# Patient Record
Sex: Male | Born: 1960 | Race: White | Hispanic: No | Marital: Married | Smoking: Former smoker
Health system: Southern US, Community
[De-identification: ages and names within clinical notes are randomized; demographics above are authoritative.]

## PROBLEM LIST (undated history)

## (undated) DIAGNOSIS — M549 Dorsalgia, unspecified: Secondary | ICD-10-CM

## (undated) DIAGNOSIS — G8929 Other chronic pain: Secondary | ICD-10-CM

## (undated) DIAGNOSIS — I10 Essential (primary) hypertension: Secondary | ICD-10-CM

## (undated) DIAGNOSIS — E7800 Pure hypercholesterolemia, unspecified: Secondary | ICD-10-CM

## (undated) HISTORY — PX: BACK SURGERY: SHX140

## (undated) HISTORY — PX: CORONARY ANGIOPLASTY WITH STENT PLACEMENT: SHX49

---

## 2003-05-25 ENCOUNTER — Encounter: Payer: Self-pay | Admitting: Family Medicine

## 2003-05-25 ENCOUNTER — Emergency Department (HOSPITAL_COMMUNITY): Admission: AD | Admit: 2003-05-25 | Discharge: 2003-05-25 | Payer: Self-pay | Admitting: Family Medicine

## 2009-06-27 ENCOUNTER — Emergency Department: Payer: Self-pay | Admitting: Emergency Medicine

## 2009-10-27 ENCOUNTER — Emergency Department (HOSPITAL_COMMUNITY): Admission: EM | Admit: 2009-10-27 | Discharge: 2009-10-27 | Payer: Self-pay | Admitting: Emergency Medicine

## 2010-10-27 LAB — POCT CARDIAC MARKERS
CKMB, poc: 1.4 ng/mL (ref 1.0–8.0)
CKMB, poc: <1 ng/mL — ABNORMAL LOW (ref 1.0–8.0)
Myoglobin, poc: 52.3 ng/mL (ref 12–200)
Troponin i, poc: <0.05 ng/mL (ref 0.00–0.09)

## 2010-10-27 LAB — DIFFERENTIAL
Basophils Absolute: 0.2 10*3/uL — ABNORMAL HIGH (ref 0.0–0.1)
Eosinophils Relative: 4 % (ref 0–5)
Lymphocytes Relative: 33 % (ref 12–46)
Monocytes Absolute: 0.6 10*3/uL (ref 0.1–1.0)
Monocytes Relative: 7 % (ref 3–12)

## 2010-10-27 LAB — COMPREHENSIVE METABOLIC PANEL
AST: 23 U/L (ref 0–37)
Albumin: 4 g/dL (ref 3.5–5.2)
Chloride: 103 mEq/L (ref 96–112)
Creatinine, Ser: 0.83 mg/dL (ref 0.4–1.5)
GFR calc Af Amer: >60 mL/min (ref >60)
Potassium: 4.1 mEq/L (ref 3.5–5.1)
Total Bilirubin: 0.3 mg/dL (ref 0.3–1.2)

## 2010-10-27 LAB — URINALYSIS, ROUTINE W REFLEX MICROSCOPIC
Protein, ur: NEGATIVE mg/dL
Urobilinogen, UA: 1 mg/dL (ref 0.0–1.0)

## 2010-10-27 LAB — HEMOCCULT GUIAC POC 1CARD (OFFICE): Fecal Occult Bld: NEGATIVE

## 2010-10-27 LAB — CBC
MCV: 94.1 fL (ref 78.0–100.0)
Platelets: 201 10*3/uL (ref 150–400)
WBC: 8.7 10*3/uL (ref 4.0–10.5)

## 2011-05-30 ENCOUNTER — Emergency Department: Payer: Self-pay | Admitting: Unknown Physician Specialty

## 2011-07-12 ENCOUNTER — Ambulatory Visit: Payer: Self-pay

## 2012-06-12 ENCOUNTER — Inpatient Hospital Stay: Payer: Self-pay | Admitting: Internal Medicine

## 2012-06-12 LAB — TROPONIN I
Troponin-I: <0.02 ng/mL
Troponin-I: 0.3 ng/mL — ABNORMAL HIGH

## 2012-06-12 LAB — CBC
HCT: 41.7 % (ref 40.0–52.0)
MCH: 32.3 pg (ref 26.0–34.0)
MCV: 92 fL (ref 80–100)
Platelet: 218 10*3/uL (ref 150–440)
RBC: 4.51 10*6/uL (ref 4.40–5.90)
WBC: 8.6 10*3/uL (ref 3.8–10.6)

## 2012-06-12 LAB — BASIC METABOLIC PANEL
Anion Gap: 5 — ABNORMAL LOW (ref 7–16)
Calcium, Total: 8.9 mg/dL (ref 8.5–10.1)
Chloride: 106 mmol/L (ref 98–107)
Osmolality: 277 (ref 275–301)
Potassium: 3.9 mmol/L (ref 3.5–5.1)

## 2012-06-12 LAB — CK TOTAL AND CKMB (NOT AT ARMC)
CK, Total: 121 U/L (ref 35–232)
CK-MB: 2.1 ng/mL (ref 0.5–3.6)

## 2012-06-13 LAB — CK TOTAL AND CKMB (NOT AT ARMC)
CK, Total: 165 U/L (ref 35–232)
CK, Total: 144 U/L (ref 35–232)
CK-MB: 7.1 ng/mL — ABNORMAL HIGH (ref 0.5–3.6)

## 2012-06-13 LAB — BASIC METABOLIC PANEL
Calcium, Total: 8.7 mg/dL (ref 8.5–10.1)
Co2: 28 mmol/L (ref 21–32)
EGFR (Non-African Amer.): >60
Glucose: 86 mg/dL (ref 65–99)
Osmolality: 282 (ref 275–301)
Potassium: 4.2 mmol/L (ref 3.5–5.1)
Sodium: 142 mmol/L (ref 136–145)

## 2012-06-13 LAB — TROPONIN I: Troponin-I: 3.6 ng/mL — ABNORMAL HIGH

## 2012-06-13 LAB — CBC WITH DIFFERENTIAL/PLATELET
Eosinophil #: 0.3 10*3/uL (ref 0.0–0.7)
Eosinophil %: 3.9 %
Lymphocyte %: 38.6 %
MCH: 33 pg (ref 26.0–34.0)
Monocyte #: 0.7 x10 3/mm (ref 0.2–1.0)
Neutrophil %: 48.2 %
Platelet: 195 10*3/uL (ref 150–440)
RBC: 4.22 10*6/uL — ABNORMAL LOW (ref 4.40–5.90)

## 2012-06-13 LAB — URINALYSIS, COMPLETE
Bacteria: NONE SEEN
Glucose,UR: NEGATIVE mg/dL (ref 0–75)
Nitrite: NEGATIVE
Ph: 6 (ref 4.5–8.0)
Specific Gravity: 1.009 (ref 1.003–1.030)
Squamous Epithelial: 1

## 2012-06-13 LAB — LIPID PANEL
Cholesterol: 218 mg/dL — ABNORMAL HIGH (ref 0–200)
HDL Cholesterol: 26 mg/dL — ABNORMAL LOW (ref 40–60)
VLDL Cholesterol, Calc: 48 mg/dL — ABNORMAL HIGH (ref 5–40)

## 2012-06-13 LAB — HEMOGLOBIN A1C: Hemoglobin A1C: 5.2 % (ref 4.2–6.3)

## 2012-06-14 LAB — BASIC METABOLIC PANEL
Anion Gap: 8 (ref 7–16)
Calcium, Total: 8.6 mg/dL (ref 8.5–10.1)
Co2: 25 mmol/L (ref 21–32)
Creatinine: 0.75 mg/dL (ref 0.60–1.30)
EGFR (African American): >60
Osmolality: 274 (ref 275–301)

## 2012-07-04 ENCOUNTER — Encounter: Payer: Self-pay | Admitting: Cardiology

## 2012-08-03 ENCOUNTER — Encounter: Payer: Self-pay | Admitting: Cardiology

## 2013-02-04 ENCOUNTER — Emergency Department: Payer: Self-pay | Admitting: Emergency Medicine

## 2013-02-04 LAB — TROPONIN I: Troponin-I: <0.02 ng/mL

## 2013-02-04 LAB — PROTIME-INR: Prothrombin Time: 12.7 secs (ref 11.5–14.7)

## 2013-02-04 LAB — BASIC METABOLIC PANEL
Calcium, Total: 8.9 mg/dL (ref 8.5–10.1)
Chloride: 108 mmol/L — ABNORMAL HIGH (ref 98–107)
EGFR (African American): >60
EGFR (Non-African Amer.): >60
Osmolality: 277 (ref 275–301)
Potassium: 5.3 mmol/L — ABNORMAL HIGH (ref 3.5–5.1)
Sodium: 139 mmol/L (ref 136–145)

## 2013-02-04 LAB — CBC
MCH: 33.5 pg (ref 26.0–34.0)
MCHC: 35.5 g/dL (ref 32.0–36.0)
Platelet: 208 10*3/uL (ref 150–440)

## 2013-02-04 LAB — PRO B NATRIURETIC PEPTIDE: B-Type Natriuretic Peptide: 79 pg/mL (ref 0–125)

## 2013-11-06 ENCOUNTER — Emergency Department: Payer: Self-pay | Admitting: Emergency Medicine

## 2013-11-06 LAB — CBC
HCT: 41.7 % (ref 40.0–52.0)
HGB: 14.3 g/dL (ref 13.0–18.0)
MCH: 31.9 pg (ref 26.0–34.0)
MCHC: 34.3 g/dL (ref 32.0–36.0)
MCV: 93 fL (ref 80–100)
PLATELETS: 191 10*3/uL (ref 150–440)
RBC: 4.49 10*6/uL (ref 4.40–5.90)
RDW: 13.2 % (ref 11.5–14.5)
WBC: 8.4 10*3/uL (ref 3.8–10.6)

## 2013-11-06 LAB — BASIC METABOLIC PANEL
ANION GAP: 3 — AB (ref 7–16)
BUN: 14 mg/dL (ref 7–18)
Calcium, Total: 8.6 mg/dL (ref 8.5–10.1)
Chloride: 108 mmol/L — ABNORMAL HIGH (ref 98–107)
Co2: 28 mmol/L (ref 21–32)
Creatinine: 0.86 mg/dL (ref 0.60–1.30)
Glucose: 97 mg/dL (ref 65–99)
Osmolality: 278 (ref 275–301)
Potassium: 4.3 mmol/L (ref 3.5–5.1)
Sodium: 139 mmol/L (ref 136–145)

## 2013-11-06 LAB — TROPONIN I: Troponin-I: <0.02 ng/mL

## 2014-11-20 NOTE — Discharge Summary (Signed)
PATIENT NAME:  Shawn Shelton, Shawn Shelton MR#:  [id number] DATE OF BIRTH:  [birth year]  DATE OF ADMISSION:  06/12/2012 DATE OF DISCHARGE:  06/14/2012  PRIMARY CARE PHYSICIAN: Dr. Lacie ScottsNiemeyer   CONSULTATION: Dr. Lady GaryFath, Cardiology   PROCEDURE: PCI   DISCHARGE DIAGNOSES:  1. Non-STEMI status post PCI stent placement.  2. Coronary artery disease.  3. Hypertension.  4. Hyperlipidemia.  5. Gastroesophageal reflux disease. 6. Tobacco abuse.    CONDITION: Stable.   CODE STATUS: FULL CODE.   HOME MEDICATIONS:  1. Aspirin 325 mg p.o. daily.  2. Lisinopril 10 mg p.o. daily.  3. Nitroglycerin 0.4 mg sublingual tablet 1 tablet every five minutes up to 3 times p.r.n. for angina, hypertension.  4. Plavix 75 mg p.o. daily.  5. Lopressor 0.5 mg tablet p.o. b.i.d.  6. Lipitor 40 mg p.o. at bedtime.   DO NOT TAKE: Diclofenac sodium 75 mg p.o. b.i.d. p.r.n. for pain.   DIET: Low sodium, low fat, low cholesterol diet.   ACTIVITY: As tolerated.   FOLLOW-UP CARE:  1. Follow-up with PCP within 1 to 2 weeks. 2. Follow-up with Dr. Lady GaryFath within one week.   Also, the patient was counseled for smoking cessation.   REASON FOR ADMISSION: Chest pain.   HOSPITAL COURSE: The patient is a 54 year old Caucasian male with a history of GERD and smoking who presented to the ED with chest pain which was a burning sensation, was more heavy, he could not breathe, with radiation to the left shoulder and toward the back. For detailed history and physical examination, please refer to the admission note dictated by Dr. Nemiah CommanderKalisetti.  On admission date, the patient's troponin was 0.02, then increased to 0.3, then increased to 3.6. The patient was admitted for non-STEMI and was treated with heparin drip and nitroglycerin. Dr. Lady GaryFath did a cardiac cath which showed 90% ulcerated lesion in OM3 . The patient underwent PCI with DES. Dr. Lady GaryFath suggested Plavix, aspirin, and statin. The patient needs Plavix for one year and should stop smoking. In  addition, the patient's lipid panel showed LDL 144, HDL 26 with the diagnosis of hyperlipidemia. After PCI the patient has no symptoms at all. His vital signs are stable. He is clinically stable and will be discharged to home today.  I discussed the patient's discharge plan with the patient, the patient's wife, Dr. Lady GaryFath, and the case manager.   TIME SPENT: About 40 minutes.   ____________________________ Shawn Shelton Myesha Stillion, MD qc:drc D: 06/14/2012 15:42:37 ET T: 06/15/2012 11:19:35 ET JOB#: [id number]  cc: Shawn Shelton Josecarlos Harriott, MD, <Dictator> Meindert A. Lacie ScottsNiemeyer, MD Shawn Shelton Trinity Hyland MD ELECTRONICALLY SIGNED 06/16/2012 22:15

## 2014-11-20 NOTE — H&P (Signed)
PATIENT NAME:  Shawn Shelton, Shawn Shelton MR#:  [id number] DATE OF BIRTH:  1961/02/19  DATE OF ADMISSION:  06/12/2012  ADMITTING PHYSICIAN: Dr. Enid Baas  PRIMARY CARE PHYSICIAN: Dr. Lacie Scotts  CHIEF COMPLAINT: Chest pain.   HISTORY OF PRESENT ILLNESS: Shawn Shelton is a 55 year old Caucasian male with past medical history significant for gastroesophageal reflux disease problem and also ongoing smoking who was working in his yard this morning not doing a lot of strenuous work, was just raking some leaves presented to the ER complaining of chest pain. Patient says he has had reflux problems in the past mostly associated after eating at parties that improved immediately with symptomatic management. It initially started like heartburn in the chest and then it was more heavy that he couldn't breath, was radiating to his left shoulder and towards the back. He went inside the home and took some aspirin. He said he was sweating, felt hot and he also had some vinegar. He started belching after taking vinegar and thought that he felt better and then went outside just standing there, got the chest pain back again and came to the ER. Patient had EGD, colonoscopy done by Dr. Niel Hummer in the past 3 or 4 months ago which were normal according to him. Never had a stress test or other kind of cardiac work-up done.   PAST MEDICAL HISTORY:  1. Borderline diabetes mellitus.  2. Tobacco use disorder.  3. Recent back muscle spasm for which he is doing physical therapy.   PAST SURGICAL HISTORY: None.   MEDICATIONS AT HOME: He started on Diclofenac 75 mg p.o. b.i.d., has been taking it for the past month. He was also on prednisone taper which he finished about two weeks ago.   ALLERGIES TO MEDICATIONS: No known drug allergies.   SOCIAL HISTORY: Lives at home with his wife. Works as a [employer]. Occasional alcohol use. Smokes about 1 pack per day.   FAMILY HISTORY: Dad died in 9s, probably had kidney problems, patient is  not sure about and mom died in her 58s and he thinks it is a heart attack but is not completely sure.    REVIEW OF SYSTEMS: CONSTITUTIONAL: No fever, fatigue, weakness. EYES: No blurred vision, double vision, pain, inflammation or glaucoma. Uses contact lenses. ENT: No tinnitus, ear pain, hearing loss, epistaxis or discharge. RESPIRATORY: No cough, wheeze, hemoptysis, or chronic obstructive pulmonary disease. CARDIOVASCULAR: Positive for chest pain. Positive for orthopnea. No edema, arrhythmia, palpitations, or syncope. GASTROINTESTINAL: No nausea, vomiting, abdominal pain, hematemesis, or melena. GENITOURINARY: No dysuria, hematuria, renal calculus, frequency, or incontinence. ENDOCRINE: No polyuria, nocturia, thyroid problems, heat or cold intolerance. HEMATOLOGY: No anemia, easy bruising or bleeding. SKIN: No acne, rash, or lesions. MUSCULOSKELETAL: Positive for low back pain. No arthritis or gout. NEUROLOGICAL: No numbness, weakness, cerebrovascular accident, transient ischemic attack, or seizures. PSYCHOLOGICAL: No anxiety, insomnia, or depression.   PHYSICAL EXAMINATION:  VITAL SIGNS: Temperature 99 degrees Fahrenheit, pulse 67, respirations 20, blood pressure 179/94, pulse oximetry 97% on room air.   GENERAL: Well built, well nourished male sitting in bed, not in any acute distress.   HEENT: Normocephalic, atraumatic. Pupils equal, round, reacting to light. Anicteric sclerae. Extraocular movements intact. Oropharynx clear without erythema, mass or exudates.    NECK: Supple. No thyromegaly, JVD, or carotid bruits. No lymphadenopathy.   LUNGS: Clear to auscultation bilaterally. No wheeze or crackles. No use of accessory muscles for breathing.   CARDIOVASCULAR: S1, S2 regular rate and rhythm. No murmurs, rubs, or gallops.  No chest wall tenderness.   ABDOMEN: Soft, nontender, nondistended. No hepatosplenomegaly. Normal bowel sounds.   EXTREMITIES: No pedal edema. No clubbing or cyanosis. 2+  dorsalis pedis pulses palpable bilaterally.   SKIN: No acne, rash, or lesions.   LYMPHATICS: No cervical or inguinal lymphadenopathy.   NEUROLOGIC: Cranial nerves intact. No focal motor or sensory deficits.   PSYCHOLOGICAL: Patient is awake, alert, oriented x3.   LABORATORY, DIAGNOSTIC AND RADIOLOGICAL DATA: WBC 8.6, hemoglobin 14.6, hematocrit 41.7, platelet count 218.   Sodium 139, potassium 3.9, chloride 106, bicarbonate 28, BUN 13, creatinine 0.8, glucose 79, calcium 8.9. Troponin first set less than 0.02. CK 113, CK-MB 2.1. Repeat. CK at 121, CK-MB 4.0, troponin 0.3. PTT is 26.6. EKG showing normal sinus rhythm, incomplete right bundle branch block.   ASSESSMENT AND PLAN: 54 year old male with no significant past medical history other than gastroesophageal reflux disease comes in for chest pain and second set of elevated troponin at 0.3.  1. Non-ST segment elevation MI. Will admit to telemetry and keep him n.p.o. after midnight. Started on IV heparin drip. Aspirin given here. Started on nitroglycerin as well. His heart rate is in the 50s so will hold off on beta blocker. Cardiology consulted. Waiting for call back from Dr. Lady GaryFath and decision about cardiac catheterization after discussion with cardiology. 2. Hypertension. No prior history, currently elevated so will start IV hydralazine p.r.n. and low dose lisinopril. Again, beta blockers were held at this time secondary to bradycardia. 3. Gastroesophageal reflux disease. Continue IV Protonix b.i.d., especially since patient has been taking diclofenac over the past month. He said he had an EGD and colonoscopy done about 4 to 5 months ago at Dr. Marlan PalauIftikhar's office which were normal.   4. Tobacco use disorder. He has been counseled for three minutes. He refused nicotine patch at this time.  5. CODE STATUS: FULL CODE.   TIME SPENT ON ADMISSION: 50 minutes. ____________________________ Enid Baasadhika Erikson Danzy, MD rk:cms D: 06/12/2012 21:28:58  ET T: 06/13/2012 07:41:04 ET  JOB#: [id number] cc: Enid Baasadhika Aspin Palomarez, MD, <Dictator> Meindert A. Lacie ScottsNiemeyer, MD Lurline DelShaukat Iftikhar, MD Enid BaasADHIKA Marshawn Normoyle MD ELECTRONICALLY SIGNED 06/13/2012 17:09

## 2014-11-20 NOTE — Consult Note (Signed)
    General Aspect 54 yo male with history of borderline hypertension, hyperlipidemia and tobacco abuse who was admitted after developing chest pain while raking leaves yesterday. He presented to the er where he was note too have a mildly elevated serum troponin. He was admtted and subsequent troponin was elevated to 3.25. He has had no further chest pain. He has tobacco abuse, hypertension and hyperlipidemia. No family history.   Physical Exam:   GEN well developed, well nourished, no acute distress    HEENT PERRL, hearing intact to voice    NECK supple  No masses    RESP normal resp effort  clear BS    CARD Regular rate and rhythm  No murmur    ABD denies tenderness  no hernia  normal BS    LYMPH negative neck, negative axillae    EXTR negative cyanosis/clubbing, negative edema    SKIN normal to palpation    NEURO cranial nerves intact, motor/sensory function intact    PSYCH A+O to time, place, person   Review of Systems:   Subjective/Chief Complaint exertional and rest chest pain    General: No Complaints    Skin: No Complaints    ENT: No Complaints    Eyes: No Complaints    Neck: No Complaints    Respiratory: No Complaints    Cardiovascular: Chest pain or discomfort    Gastrointestinal: No Complaints    Genitourinary: No Complaints    Vascular: No Complaints    Musculoskeletal: No Complaints    Neurologic: No Complaints    Hematologic: No Complaints    Endocrine: No Complaints    Psychiatric: No Complaints    Review of Systems: All other systems were reviewed and found to be negative    Medications/Allergies Reviewed Medications/Allergies reviewed     Denies medical history:   Home Medications: Medication Instructions Status  diclofenac sodium 75 mg oral delayed release tablet 1 tab(s) orally 2 times a day as needed for pain.  Active  Aspirin Enteric Coated 81 mg oral delayed release tablet 2 tab(s) orally once a month as needed for aches.  Active   EKG:   EKG NSR    Abnormal NSSTTW changes    No Known Allergies:     Impression 54 yo male with history of tobacco abuse and mild hpertension and hyperlipidemia who was admitted with chest pain and has ruled in for a nstemi. His troponin is elevated to 3.6. He is currently stable. He denis chest pian but has a headache. Risk factors include tobacco abuse and mild hypertension and hyperlipidemia. Risk and benefits of cardiac cath were explained to the patient and he agrees to proceed.    Plan 1. COntinue current meds including  nitrates asa. Beta blockers initially held secondary to bradycardia. Will follow heart rate and add as tolerated.  2. Smoking cessation.  3. Statin for hyperlipidemia 4. Risk and benefits of cardiac cath explained to the patient and he agrees to proceed 5. Proceed with cardiac cath today. Furhter recs after cath.   Electronic Signatures: Dalia HeadingFath, Kenneth A (MD)  (Signed 11-Nov-13 08:02)  Authored: General Aspect/Present Illness, History and Physical Exam, Review of System, Past Medical History, Home Medications, EKG , Allergies, Impression/Plan   Last Updated: 11-Nov-13 08:02 by Dalia HeadingFath, Kenneth A (MD)

## 2015-07-10 ENCOUNTER — Emergency Department: Payer: 59

## 2015-07-10 ENCOUNTER — Emergency Department
Admission: EM | Admit: 2015-07-10 | Discharge: 2015-07-10 | Disposition: A | Discharge status: Home / Self Care | Payer: 59 | Attending: Emergency Medicine | Admitting: Emergency Medicine

## 2015-07-10 ENCOUNTER — Encounter: Payer: Self-pay | Admitting: Emergency Medicine

## 2015-07-10 DIAGNOSIS — I10 Essential (primary) hypertension: Secondary | ICD-10-CM | POA: Insufficient documentation

## 2015-07-10 DIAGNOSIS — F17200 Nicotine dependence, unspecified, uncomplicated: Secondary | ICD-10-CM | POA: Insufficient documentation

## 2015-07-10 DIAGNOSIS — M25512 Pain in left shoulder: Secondary | ICD-10-CM | POA: Diagnosis not present

## 2015-07-10 HISTORY — DX: Essential (primary) hypertension: I10

## 2015-07-10 LAB — CBC
HEMATOCRIT: 43.3 % (ref 40.0–52.0)
Hemoglobin: 14.9 g/dL (ref 13.0–18.0)
MCH: 32 pg (ref 26.0–34.0)
MCHC: 34.5 g/dL (ref 32.0–36.0)
MCV: 92.9 fL (ref 80.0–100.0)
Platelets: 190 10*3/uL (ref 150–440)
RBC: 4.66 MIL/uL (ref 4.40–5.90)
RDW: 13.1 % (ref 11.5–14.5)
WBC: 8.6 10*3/uL (ref 3.8–10.6)

## 2015-07-10 LAB — BASIC METABOLIC PANEL
Anion gap: 8 (ref 5–15)
BUN: 13 mg/dL (ref 6–20)
CALCIUM: 9.3 mg/dL (ref 8.9–10.3)
CHLORIDE: 106 mmol/L (ref 101–111)
CO2: 25 mmol/L (ref 22–32)
CREATININE: 0.97 mg/dL (ref 0.61–1.24)
GFR calc non Af Amer: >60 mL/min (ref >60)
GLUCOSE: 86 mg/dL (ref 65–99)
Potassium: 4.1 mmol/L (ref 3.5–5.1)
Sodium: 139 mmol/L (ref 135–145)

## 2015-07-10 LAB — TROPONIN I
Troponin I: <0.03 ng/mL (ref <0.031)
Troponin I: <0.03 ng/mL (ref <0.031)

## 2015-07-10 MED ORDER — CYCLOBENZAPRINE HCL 10 MG PO TABS: 10.0000 mg | ORAL_TABLET | Freq: Three times a day (TID) | ORAL | Status: DC | PRN

## 2015-07-10 NOTE — ED Notes (Signed)
Pt presents with mid sternal chest pain radiating down into left arm started one week ago.

## 2015-07-10 NOTE — Discharge Instructions (Signed)
You have been seen in the Emergency Department (ED) today for chest pain.  As we have discussed todays test results are normal, but you require follow-up with cardiology tomorrow or Friday.  Please call the cardiology clinic, left them know you're seen in the ER and that Dr. Juliann Paresallwood has advised you to [place] [facility] [place] GaryFath, or with one of the other cardiologists in the clinic by this Friday.  Please follow up with the recommended doctor as instructed above in these documents regarding todays emergent visit and your recent symptoms to discuss further management.  Continue to take your regular medications.   Return to the Emergency Department (ED) if you experience any further chest pain/pressure/tightness, difficulty breathing, or sudden sweating, or other symptoms that concern you.

## 2015-07-10 NOTE — ED Provider Notes (Signed)
[facility] Emergency Department Provider Note REMINDER - THIS NOTE IS NOT A FINAL MEDICAL RECORD UNTIL IT IS SIGNED. UNTIL THEN, THE CONTENT BELOW MAY REFLECT INFORMATION FROM A DOCUMENTATION TEMPLATE, NOT THE ACTUAL PATIENT VISIT. ____________________________________________  Time seen: Approximately 6:58 PM  I have reviewed the triage vital signs and the nursing notes.   HISTORY  Chief Complaint Chest Pain    HPI Shawn Shelton is a 54 y.o. male previous history of hypertension and coronary disease.  Patient reports for about the last week he's had discomfort in the left shoulder that radiates down into the left upper arm at times, it seems to be worse with use and movement. He denies nausea, vomiting, or specifically "chest pain" except for chronic discomfort which he experiences on a near daily basis for the last 2 years. 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  No trouble breathing. No leg swelling. No cough. No headache or neck pain.  He is compliant with his medications, but has not yet taken his lisinopril or Plavix this evening. He took 325 mg of aspirin a day which she takes on a daily basis.  Past Medical History  Diagnosis Date  . Hypertension     There are no active problems to display for this patient.   History reviewed. No pertinent past surgical history.  No current outpatient prescriptions on file.  Allergies Review of patient's allergies indicates no known allergies.  No family history on file.  Social History Social History  Substance Use Topics  . Smoking status: Current Some Day Smoker  . Smokeless tobacco: None  . Alcohol Use: No    Review of Systems Constitutional: No fever/chills Eyes: No visual changes. ENT: No sore throat. Cardiovascular: Denies chest pain for chronic discomfort which is unchanged. Respiratory: Denies shortness of breath. Gastrointestinal: No  abdominal pain.  No nausea, no vomiting.  No diarrhea.  No constipation. Genitourinary: Negative for dysuria. Musculoskeletal: Negative for back pain. Skin: Negative for rash. Neurological: Negative for headaches, focal weakness or numbness. No weakness or numbness in the left hand.  10-point ROS otherwise negative.  ____________________________________________   PHYSICAL EXAM:  VITAL SIGNS: ED Triage Vitals  Enc Vitals Group     BP 07/10/15 1755 142/86 mmHg     Pulse Rate 07/10/15 1755 78     Resp 07/10/15 1755 18     Temp 07/10/15 1755 98 F (36.7 C)     Temp Source 07/10/15 1755 Oral     SpO2 07/10/15 1755 96 %     Weight 07/10/15 1755 235 lb (106.595 kg)     Height 07/10/15 1755  (1.803 m)     Head Cir --      Peak Flow --      Pain Score 07/10/15 1749 5     Pain Loc --      Pain Edu? --      Excl. in GC? --    Constitutional: Alert and oriented. Well appearing and in no acute distress. Eyes: Conjunctivae are normal. PERRL. EOMI. Head: Atraumatic. Nose: No congestion/rhinnorhea. Mouth/Throat: Mucous membranes are moist.  Oropharynx non-erythematous. Neck: No stridor.   Cardiovascular: Normal rate, regular rhythm. Grossly normal heart sounds.  Good peripheral circulation. Respiratory: Normal respiratory effort.  No retractions. Lungs CTAB. Gastrointestinal: Soft and nontender. No distention. No abdominal bruits. No CVA tenderness. Musculoskeletal: No lower extremity tenderness nor edema.  No joint effusions. Neurologic:  Normal speech  and language. No gross focal neurologic deficits are appreciated. No gait instability. Skin:  Skin is warm, dry and intact. No rash noted. Psychiatric: Mood and affect are normal. Speech and behavior are normal.  ____________________________________________   LABS (all labs ordered are listed, but only abnormal results are displayed)  Labs Reviewed  BASIC METABOLIC PANEL  TROPONIN I  CBC  TROPONIN I    ____________________________________________  EKG  Reviewed and interpreted by me at 1755 Ventricular rate 80 PR 170 QRS 100 QTc 4:30 No acute T-wave abnormality Incomplete right bundle-branch block No evidence of acute ischemic abnormality Reviewed and interpreted as normal sinus rhythm, incomplete right bundle-branch block  Discussed with Dr. Juliann Pares ____________________________________________  RADIOLOGY  DG Chest 2 View (Final result) Result time: 07/10/15 18:26:25   Final result by Rad Results In Interface (07/10/15 18:26:25)   Narrative:   CLINICAL DATA: Three-week history of chest pain and bilateral arm pain.  EXAM: CHEST 2 VIEW  COMPARISON: 02/04/2013  FINDINGS: The cardiac silhouette, mediastinal and hilar contours are within normal limits and stable. The lungs are clear. No pleural effusion. The bony thorax is intact.  IMPRESSION: No acute cardiopulmonary findings.   Electronically Signed By: Rudie Meyer M.D. On: 07/10/2015 18:26    ____________________________________________   PROCEDURES  Procedure(s) performed: None  Critical Care performed: No  ____________________________________________   INITIAL IMPRESSION / ASSESSMENT AND PLAN / ED COURSE  Pertinent labs & imaging results that were available during my care of the patient were reviewed by me and considered in my medical decision making (see chart for details).  Patient presents for evaluation of left upper arm pain. Does have a history of coronary disease with previous stents. At the present time he does not endorse any chest pain, except for chronic discomfort that he experiences on a daily basis for the last 2 years located in the lower chest. In addition, he exerted himself for about an hour and a half on a treadmill today and actually states that his symptoms improved while performing this. Exam seem to suggest musculoskeletal etiology likely strain and discomfort  centered around the left shoulder without evidence of neurovascular deficit.  His EKG and troponin are quite reassuring. He does, still have an elevated heart score because of his previous disease, and is found to be moderate risk.  Otherwise suspect likely muscular skeletal nature, I discussed with the patient as well as his on-call cardiology group Dr Juliann Pares. His cardiology team advises checking a second troponin 3 hours, and if this remains normal to discharge the patient for follow-up tomorrow or Friday.  And discussed plan with the patient, he is agreeable. Currently not endorsing any chest pain, only pain in the left arm especially with movement. 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 EKG and troponin reassuring. We will check a second troponin, plan to discharge him home if this is negative without any further recurrence or worsening of chest pain in the ER.  Careful return precautions discussed, as well as a careful plan for follow-up on Thursday or Friday which the patient is very agreeable to. He'll call the clinic tomorrow morning to set up follow-up with Dr. Lady Gary [facility] tomorrow or the [place] clinic on Friday.  ----------------------------------------- 8:36 PM on 07/10/2015 -----------------------------------------  Reevaluated, patient resting comfortably. No distress. Continue to await second troponin draw.  ----------------------------------------- 10:11 PM on 07/10/2015 -----------------------------------------  Second troponin normal. Patient denying any active concerns or symptoms. Careful return precautions advised again, patient will follow-up with Dr. Lady Gary  and cardiology tomorrow. ____________________________________________   FINAL CLINICAL IMPRESSION(S) / ED DIAGNOSES  Final diagnoses:  Left shoulder pain      Sharyn CreamerMark Burgundy Matuszak, MD 07/10/15 2212

## 2015-07-10 NOTE — ED Notes (Signed)
Discharge instructions done with pt.  Pt voiced understanding.  No questions or concern at this time.  Pt in NAD.  Items with pt upon discharge.  No items left in pt room.

## 2015-07-10 NOTE — ED Notes (Signed)
Patient is resting comfortably at this time.  Report received from Rocky ComfortAngela, [place].

## 2015-07-10 NOTE — ED Notes (Signed)
Patient presents ED room 4 from triage in no acute distress. Patient states that he has been having left arm pain for the past week. Patient denies shortness of breath, nausea, and vomiting but has had some episodes of dizziness.   Patient states that the pain does not increase with exercise, and states that he walked a mile and a half today on the treadmill which seemed to relieve the pain.

## 2016-02-16 ENCOUNTER — Emergency Department: Payer: BLUE CROSS/BLUE SHIELD

## 2016-02-16 ENCOUNTER — Encounter: Payer: Self-pay | Admitting: Emergency Medicine

## 2016-02-16 DIAGNOSIS — Z7982 Long term (current) use of aspirin: Secondary | ICD-10-CM | POA: Diagnosis not present

## 2016-02-16 DIAGNOSIS — F17200 Nicotine dependence, unspecified, uncomplicated: Secondary | ICD-10-CM | POA: Diagnosis not present

## 2016-02-16 DIAGNOSIS — R0789 Other chest pain: Secondary | ICD-10-CM | POA: Diagnosis present

## 2016-02-16 DIAGNOSIS — I2510 Atherosclerotic heart disease of native coronary artery without angina pectoris: Secondary | ICD-10-CM | POA: Insufficient documentation

## 2016-02-16 DIAGNOSIS — Z79899 Other long term (current) drug therapy: Secondary | ICD-10-CM | POA: Insufficient documentation

## 2016-02-16 DIAGNOSIS — F419 Anxiety disorder, unspecified: Secondary | ICD-10-CM | POA: Insufficient documentation

## 2016-02-16 DIAGNOSIS — E785 Hyperlipidemia, unspecified: Secondary | ICD-10-CM | POA: Insufficient documentation

## 2016-02-16 DIAGNOSIS — Z7902 Long term (current) use of antithrombotics/antiplatelets: Secondary | ICD-10-CM | POA: Insufficient documentation

## 2016-02-16 DIAGNOSIS — I10 Essential (primary) hypertension: Secondary | ICD-10-CM | POA: Insufficient documentation

## 2016-02-16 LAB — TROPONIN I: Troponin I: <0.03 ng/mL (ref <0.03)

## 2016-02-16 LAB — CBC
HEMATOCRIT: 42.1 % (ref 40.0–52.0)
HEMOGLOBIN: 14.6 g/dL (ref 13.0–18.0)
MCH: 32.6 pg (ref 26.0–34.0)
MCHC: 34.6 g/dL (ref 32.0–36.0)
MCV: 94.1 fL (ref 80.0–100.0)
Platelets: 186 10*3/uL (ref 150–440)
RBC: 4.47 MIL/uL (ref 4.40–5.90)
RDW: 13.4 % (ref 11.5–14.5)
WBC: 9.8 10*3/uL (ref 3.8–10.6)

## 2016-02-16 LAB — BASIC METABOLIC PANEL
ANION GAP: 4 — AB (ref 5–15)
BUN: 17 mg/dL (ref 6–20)
CO2: 28 mmol/L (ref 22–32)
Calcium: 9 mg/dL (ref 8.9–10.3)
Chloride: 105 mmol/L (ref 101–111)
Creatinine, Ser: 0.97 mg/dL (ref 0.61–1.24)
GFR calc Af Amer: >60 mL/min (ref >60)
Glucose, Bld: 93 mg/dL (ref 65–99)
POTASSIUM: 4.4 mmol/L (ref 3.5–5.1)
SODIUM: 137 mmol/L (ref 135–145)

## 2016-02-16 LAB — LIPASE, BLOOD: LIPASE: 37 U/L (ref 11–51)

## 2016-02-16 NOTE — ED Notes (Addendum)
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

## 2016-02-17 ENCOUNTER — Emergency Department
Admission: EM | Admit: 2016-02-17 | Discharge: 2016-02-17 | Disposition: A | Discharge status: Home / Self Care | Payer: BLUE CROSS/BLUE SHIELD | Attending: Emergency Medicine | Admitting: Emergency Medicine

## 2016-02-17 ENCOUNTER — Emergency Department: Payer: BLUE CROSS/BLUE SHIELD

## 2016-02-17 DIAGNOSIS — F419 Anxiety disorder, unspecified: Secondary | ICD-10-CM

## 2016-02-17 DIAGNOSIS — R079 Chest pain, unspecified: Secondary | ICD-10-CM

## 2016-02-17 HISTORY — DX: Pure hypercholesterolemia, unspecified: E78.00

## 2016-02-17 HISTORY — DX: Other chronic pain: G89.29

## 2016-02-17 HISTORY — DX: Dorsalgia, unspecified: M54.9

## 2016-02-17 LAB — TROPONIN I

## 2016-02-17 MED ORDER — LORAZEPAM 1 MG PO TABS: 1.0000 mg | ORAL_TABLET | Freq: Three times a day (TID) | ORAL | Status: DC | PRN

## 2016-02-17 NOTE — ED Provider Notes (Signed)
[facility] Emergency Department Provider Note   ____________________________________________  Time seen: Approximately 2:17 AM  I have reviewed the triage vital signs and the nursing notes.   HISTORY  Chief Complaint Chest Pain    HPI Shawn Shelton is a 55 y.o. male who presents to the ED from work with a chief complaint of sweating, chest pain, "pains all over", dizziness and anxiety. Patient reports he has had chronic chest pain since a cardiac stent in 2013. States the triage nurse did not understand why he is here. Tells me he is here because he began to sweat while driving to work, took his blood pressure once he got to work which was 174/80 and wanted to be evaluated. States he thinks he began to sweat because he was "worked up" leaving the house as he and his spouse have been arguing recently and had an argument as he was getting ready for work. Denies recent fever, chills, shortness of breath, nausea, vomiting, diarrhea. Denies headache, vision changes, neck pain, extremity weakness, numbness/tingling. States he often has upper abdominal discomfort which he attributes to acid reflux. Takes over-the-counter medicines as needed for that.Denies recent travel or trauma. Nothing makes his symptoms better or worse. Currently patient is resting without complaints.   Past Medical History  Diagnosis Date  . Hypertension   . High cholesterol   . Chronic back pain   1.Non-STEMI status post PCI stent placement.  2.Coronary artery disease.  3.Hypertension.  4.Hyperlipidemia.  5.Gastroesophageal reflux disease. 6.Tobacco abuse.   There are no active problems to display for this patient.   Past Surgical History  Procedure Laterality Date  . Back surgery      Current Outpatient Rx  Name  Route  Sig  Dispense  Refill  . aspirin EC 325 MG tablet   Oral   Take 325 mg by mouth daily.         Marland Kitchen  atorvastatin (LIPITOR) 40 MG tablet   Oral   Take 40 mg by mouth daily.         . clopidogrel (PLAVIX) 75 MG tablet   Oral   Take 75 mg by mouth daily.         Marland Kitchen lisinopril (PRINIVIL,ZESTRIL) 10 MG tablet   Oral   Take 10 mg by mouth daily.         . metoprolol tartrate (LOPRESSOR) 25 MG tablet   Oral   Take 12.5 mg by mouth 2 (two) times daily.         . cyclobenzaprine (FLEXERIL) 10 MG tablet   Oral   Take 1 tablet (10 mg total) by mouth every 8 (eight) hours as needed for muscle spasms (do not drive while using, may make you drowsy. 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 Patient not taking: Reported on 02/17/2016   20 tablet   0   . LORazepam (ATIVAN) 1 MG tablet   Oral   Take 1 tablet (1 mg total) by mouth every 8 (eight) hours as needed for anxiety.   15 tablet   0     Allergies Review of patient's allergies indicates no known allergies.  History reviewed. No pertinent family history.  Social History Social History  Substance Use Topics  . Smoking status: Current Some Day Smoker  . Smokeless tobacco: None  . Alcohol Use: No    Review of Systems  Constitutional: Positive for sweating. No fever/chills. Eyes: No visual changes. ENT: No sore  throat. Cardiovascular: Positive for chronic chest pain. Respiratory: Denies shortness of breath. Gastrointestinal: No abdominal pain.  No nausea, no vomiting.  No diarrhea.  No constipation. Genitourinary: Negative for dysuria. Musculoskeletal: Negative for back pain. Skin: Negative for rash. Neurological: Negative for headaches, focal weakness or numbness. Psychiatric:Positive for emotional upset. Denies SI/HI/AH/VH.  10-point ROS otherwise negative.  ____________________________________________   PHYSICAL EXAM:  VITAL SIGNS: ED Triage Vitals  Enc Vitals Group     BP 02/16/16 2216 146/69 mmHg     Pulse Rate 02/16/16 2216 62     Resp 02/16/16 2216 18     Temp 02/16/16 2216  98.6 F (37 C)     Temp Source 02/16/16 2216 Oral     SpO2 02/16/16 2216 97 %     Weight 02/16/16 2216 240 lb (108.863 kg)     Height 02/16/16 2216 5\' 11"  (1.803 m)     Head Cir --      Peak Flow --      Pain Score 02/16/16 2217 2     Pain Loc --      Pain Edu? --      Excl. in GC? --     Constitutional: Alert and oriented. Well appearing and in no acute distress. Eyes: Conjunctivae are normal. PERRL. EOMI. Head: Atraumatic. Nose: No congestion/rhinnorhea. Mouth/Throat: Mucous membranes are moist.  Oropharynx non-erythematous. Neck: No stridor.  No carotid bruits. Cardiovascular: Normal rate, regular rhythm. Grossly normal heart sounds.  Good peripheral circulation. Respiratory: Normal respiratory effort.  No retractions. Lungs CTAB. Gastrointestinal: Soft and nontender. No distention. No abdominal bruits. No CVA tenderness. Musculoskeletal: No lower extremity tenderness nor edema.  No joint effusions. Neurologic:  Normal speech and language. No gross focal neurologic deficits are appreciated. No gait instability. Skin:  Skin is warm, dry and intact. No rash noted. No diaphoresis. Psychiatric: Mood and affect are normal. Speech and behavior are normal.  ____________________________________________   LABS (all labs ordered are listed, but only abnormal results are displayed)  Labs Reviewed  BASIC METABOLIC PANEL - Abnormal; Notable for the following:    Anion gap 4 (*)    All other components within normal limits  CBC  TROPONIN I  LIPASE, BLOOD  TROPONIN I   ____________________________________________  EKG  ED ECG REPORT I, SUNG,JADE J, the attending physician, personally viewed and interpreted this ECG.   Date: 02/17/2016  EKG Time: 2204  Rate: 63  Rhythm: normal EKG, normal sinus rhythm  Axis: RAD  Intervals:right bundle branch block  ST&T Change: Nonspecific  ____________________________________________  RADIOLOGY  Chest 2 view (view by me, interpreted  per Dr. Mayford Knife): No active cardiopulmonary disease. ____________________________________________   PROCEDURES  Procedure(s) performed: None  Procedures  Critical Care performed: No  ____________________________________________   INITIAL IMPRESSION / ASSESSMENT AND PLAN / ED COURSE  Pertinent labs & imaging results that were available during my care of the patient were reviewed by me and considered in my medical decision making (see chart for details).  55 year old male with a history of CAD s/p stent in 2013 with chronic chest pain since who presents with a sweating episode, dizziness, transient hypertension and anxiety. Initial EKG and troponin are reassuring. Awaiting timed troponin. Patient currently voices no medical complaints. ____________________________________________   FINAL CLINICAL IMPRESSION(S) / ED DIAGNOSES  Final diagnoses:  Chest pain, unspecified chest pain type  Anxiety      NEW MEDICATIONS STARTED DURING THIS VISIT:  Discharge Medication List as of 02/17/2016  3:47 AM  START taking these medications   Details  LORazepam (ATIVAN) 1 MG tablet Take 1 tablet (1 mg total) by mouth every 8 (eight) hours as needed for anxiety., Starting 02/17/2016, Until Discontinued, Print         Note:  This document was prepared using Dragon voice recognition software and may include unintentional dictation errors.    Irean HongJade J Sung, MD 02/17/16 [phone]

## 2016-02-17 NOTE — Discharge Instructions (Signed)
1. You may take Ativan as needed for stress/anxiety. 2. Return to the ER for worsening symptoms, persistent vomiting, difficult breathing or other concerns.  Nonspecific Chest Pain  Chest pain can be caused by many different conditions. There is always a chance that your pain could be related to something serious, such as a heart attack or a blood clot in your lungs. Chest pain can also be caused by conditions that are not life-threatening. If you have chest pain, it is very important to follow up with your health care provider. CAUSES  Chest pain can be caused by:  Heartburn.  Pneumonia or bronchitis.  Anxiety or stress.  Inflammation around your heart (pericarditis) or lung (pleuritis or pleurisy).  A blood clot in your lung.  A collapsed lung (pneumothorax). It can develop suddenly on its own (spontaneous pneumothorax) or from trauma to the chest.  Shingles infection (varicella-zoster virus).  Heart attack.  Damage to the bones, muscles, and cartilage that make up your chest wall. This can include:  Bruised bones due to injury.  Strained muscles or cartilage due to frequent or repeated coughing or overwork.  Fracture to one or more ribs.  Sore cartilage due to inflammation (costochondritis). RISK FACTORS  Risk factors for chest pain may include:  Activities that increase your risk for trauma or injury to your chest.  Respiratory infections or conditions that cause frequent coughing.  Medical conditions or overeating that can cause heartburn.  Heart disease or family history of heart disease.  Conditions or health behaviors that increase your risk of developing a blood clot.  Having had chicken pox (varicella zoster). SIGNS AND SYMPTOMS Chest pain can feel like:  Burning or tingling on the surface of your chest or deep in your chest.  Crushing, pressure, aching, or squeezing pain.  Dull or sharp pain that is worse when you move, cough, or take a deep  breath.  Pain that is also felt in your back, neck, shoulder, or arm, or pain that spreads to any of these areas. Your chest pain may come and go, or it may stay constant. DIAGNOSIS Lab tests or other studies may be needed to find the cause of your pain. Your health care provider may have you take a test called an ambulatory ECG (electrocardiogram). An ECG records your heartbeat patterns at the time the test is performed. You may also have other tests, such as:  Transthoracic echocardiogram (TTE). During echocardiography, sound waves are used to create a picture of all of the heart structures and to look at how blood flows through your heart.  Transesophageal echocardiogram (TEE).This is a more advanced imaging test that obtains images from inside your body. It allows your health care provider to see your heart in finer detail.  Cardiac monitoring. This allows your health care provider to monitor your heart rate and rhythm in real time.  Holter monitor. This is a portable device that records your heartbeat and can help to diagnose abnormal heartbeats. It allows your health care provider to track your heart activity for several days, if needed.  Stress tests. These can be done through exercise or by taking medicine that makes your heart beat more quickly.  Blood tests.  Imaging tests. TREATMENT  Your treatment depends on what is causing your chest pain. Treatment may include:  Medicines. These may include:  Acid blockers for heartburn.  Anti-inflammatory medicine.  Pain medicine for inflammatory conditions.  Antibiotic medicine, if an infection is present.  Medicines to dissolve blood clots.  Medicines to treat coronary artery disease.  Supportive care for conditions that do not require medicines. This may include:  Resting.  Applying heat or cold packs to injured areas.  Limiting activities until pain decreases. HOME CARE INSTRUCTIONS  If you were prescribed an  antibiotic medicine, finish it all even if you start to feel better.  Avoid any activities that bring on chest pain.  Do not use any tobacco products, including cigarettes, chewing tobacco, or electronic cigarettes. If you need help quitting, ask your health care provider.  Do not drink alcohol.  Take medicines only as directed by your health care provider.  Keep all follow-up visits as directed by your health care provider. This is important. This includes any further testing if your chest pain does not go away.  If heartburn is the cause for your chest pain, you may be told to keep your head raised (elevated) while sleeping. This reduces the chance that acid will go from your stomach into your esophagus.  Make lifestyle changes as directed by your health care provider. These may include:  Getting regular exercise. Ask your health care provider to suggest some activities that are safe for you.  Eating a heart-healthy diet. A registered dietitian can help you to learn healthy eating options.  Maintaining a healthy weight.  Managing diabetes, if necessary.  Reducing stress. SEEK MEDICAL CARE IF:  Your chest pain does not go away after treatment.  You have a rash with blisters on your chest.  You have a fever. SEEK IMMEDIATE MEDICAL CARE IF:   Your chest pain is worse.  You have an increasing cough, or you cough up blood.  You have severe abdominal pain.  You have severe weakness.  You faint.  You have chills.  You have sudden, unexplained chest discomfort.  You have sudden, unexplained discomfort in your arms, back, neck, or jaw.  You have shortness of breath at any time.  You suddenly start to sweat, or your skin gets clammy.  You feel nauseous or you vomit.  You suddenly feel light-headed or dizzy.  Your heart begins to beat quickly, or it feels like it is skipping beats. These symptoms may represent a serious problem that is an emergency. Do not wait to  see if the symptoms will go away. Get medical help right away. Call your local emergency services (911 in the [place] Do not drive yourself to the hospital.   This information is not intended to replace advice given to you by your health care provider. Make sure you discuss any questions you have with your health care provider.   Document Released: 04/29/2005 Document Revised: 08/10/2014 Document Reviewed: 02/23/2014 Elsevier Interactive Patient Education [employer].

## 2016-02-17 NOTE — ED Notes (Signed)
Pt states that their pain scale is about a (2). Pt verbalized to staff that he still feels dizzy and is having headaches. Pt also states that he has a new aching pain in his lower abdomen.

## 2016-02-17 NOTE — ED Notes (Signed)
Spoke with Dr. Dolores FrameSung regarding patient, new Troponin ordered, no further orders.

## 2016-02-17 NOTE — ED Notes (Signed)
Pt alert and oriented X4, active, cooperative, pt in NAD. RR even and unlabored, color WNL.  Pt informed to return if any life threatening symptoms occur.   

## 2017-11-30 ENCOUNTER — Encounter: Payer: Self-pay | Admitting: Emergency Medicine

## 2017-11-30 ENCOUNTER — Emergency Department: Payer: 59

## 2017-11-30 ENCOUNTER — Inpatient Hospital Stay
Admission: EM | Admit: 2017-11-30 | Discharge: 2017-12-02 | DRG: 247 | Disposition: A | Discharge status: Home / Self Care | Payer: 59 | Attending: Family Medicine | Admitting: Family Medicine

## 2017-11-30 ENCOUNTER — Other Ambulatory Visit: Payer: Self-pay

## 2017-11-30 DIAGNOSIS — Y712 Prosthetic and other implants, materials and accessory cardiovascular devices associated with adverse incidents: Secondary | ICD-10-CM | POA: Diagnosis present

## 2017-11-30 DIAGNOSIS — Z8349 Family history of other endocrine, nutritional and metabolic diseases: Secondary | ICD-10-CM

## 2017-11-30 DIAGNOSIS — K219 Gastro-esophageal reflux disease without esophagitis: Secondary | ICD-10-CM | POA: Diagnosis present

## 2017-11-30 DIAGNOSIS — I2510 Atherosclerotic heart disease of native coronary artery without angina pectoris: Secondary | ICD-10-CM | POA: Diagnosis present

## 2017-11-30 DIAGNOSIS — E785 Hyperlipidemia, unspecified: Secondary | ICD-10-CM | POA: Diagnosis present

## 2017-11-30 DIAGNOSIS — Z87891 Personal history of nicotine dependence: Secondary | ICD-10-CM

## 2017-11-30 DIAGNOSIS — M549 Dorsalgia, unspecified: Secondary | ICD-10-CM | POA: Diagnosis present

## 2017-11-30 DIAGNOSIS — I214 Non-ST elevation (NSTEMI) myocardial infarction: Secondary | ICD-10-CM | POA: Diagnosis present

## 2017-11-30 DIAGNOSIS — Z79899 Other long term (current) drug therapy: Secondary | ICD-10-CM

## 2017-11-30 DIAGNOSIS — Z7902 Long term (current) use of antithrombotics/antiplatelets: Secondary | ICD-10-CM | POA: Diagnosis not present

## 2017-11-30 DIAGNOSIS — G8929 Other chronic pain: Secondary | ICD-10-CM | POA: Diagnosis present

## 2017-11-30 DIAGNOSIS — Z7982 Long term (current) use of aspirin: Secondary | ICD-10-CM | POA: Diagnosis not present

## 2017-11-30 DIAGNOSIS — I10 Essential (primary) hypertension: Secondary | ICD-10-CM | POA: Diagnosis present

## 2017-11-30 DIAGNOSIS — Z8249 Family history of ischemic heart disease and other diseases of the circulatory system: Secondary | ICD-10-CM | POA: Diagnosis not present

## 2017-11-30 DIAGNOSIS — Z955 Presence of coronary angioplasty implant and graft: Secondary | ICD-10-CM | POA: Diagnosis not present

## 2017-11-30 DIAGNOSIS — T82855A Stenosis of coronary artery stent, initial encounter: Secondary | ICD-10-CM | POA: Diagnosis present

## 2017-11-30 LAB — BASIC METABOLIC PANEL
Anion gap: 7 (ref 5–15)
BUN: 14 mg/dL (ref 6–20)
CALCIUM: 10.1 mg/dL (ref 8.9–10.3)
CO2: 28 mmol/L (ref 22–32)
Chloride: 104 mmol/L (ref 101–111)
Creatinine, Ser: 1.14 mg/dL (ref 0.61–1.24)
GFR calc Af Amer: >60 mL/min (ref >60)
GLUCOSE: 97 mg/dL (ref 65–99)
Potassium: 4.2 mmol/L (ref 3.5–5.1)
Sodium: 139 mmol/L (ref 135–145)

## 2017-11-30 LAB — CBC
HEMATOCRIT: 43.2 % (ref 40.0–52.0)
Hemoglobin: 15.2 g/dL (ref 13.0–18.0)
MCH: 32.6 pg (ref 26.0–34.0)
MCHC: 35.2 g/dL (ref 32.0–36.0)
MCV: 92.6 fL (ref 80.0–100.0)
Platelets: 239 10*3/uL (ref 150–440)
RBC: 4.67 MIL/uL (ref 4.40–5.90)
RDW: 13.5 % (ref 11.5–14.5)
WBC: 12 10*3/uL — ABNORMAL HIGH (ref 3.8–10.6)

## 2017-11-30 LAB — TROPONIN I: Troponin I: 0.19 ng/mL (ref <0.03)

## 2017-11-30 MED ORDER — LISINOPRIL 10 MG PO TABS
10.0000 mg | ORAL_TABLET | Freq: Every day | ORAL | Status: DC
  Administered 2017-12-01 – 2017-12-02 (×2): 10 mg via ORAL
  Filled 2017-11-30 (×2): qty 1

## 2017-11-30 MED ORDER — METOPROLOL TARTRATE 25 MG PO TABS
12.5000 mg | ORAL_TABLET | Freq: Two times a day (BID) | ORAL | Status: DC
  Administered 2017-12-01 – 2017-12-02 (×3): 12.5 mg via ORAL
  Filled 2017-11-30 (×3): qty 1

## 2017-11-30 MED ORDER — ONDANSETRON HCL 4 MG/2ML IJ SOLN
4.0000 mg | Freq: Once | INTRAMUSCULAR | Status: AC
  Administered 2017-11-30: 4 mg via INTRAVENOUS
  Filled 2017-11-30: qty 2

## 2017-11-30 MED ORDER — ONDANSETRON HCL 4 MG/2ML IJ SOLN: 4.0000 mg | Freq: Four times a day (QID) | INTRAMUSCULAR | Status: DC | PRN

## 2017-11-30 MED ORDER — ASPIRIN EC 325 MG PO TBEC
325.0000 mg | DELAYED_RELEASE_TABLET | Freq: Every day | ORAL | Status: DC
  Administered 2017-12-01 – 2017-12-02 (×2): 325 mg via ORAL
  Filled 2017-11-30 (×2): qty 1

## 2017-11-30 MED ORDER — ACETAMINOPHEN 325 MG PO TABS: 650.0000 mg | ORAL_TABLET | Freq: Four times a day (QID) | ORAL | Status: DC | PRN

## 2017-11-30 MED ORDER — ATORVASTATIN CALCIUM 20 MG PO TABS
40.0000 mg | ORAL_TABLET | Freq: Every day | ORAL | Status: DC
  Administered 2017-12-01 – 2017-12-02 (×2): 40 mg via ORAL
  Filled 2017-11-30 (×2): qty 2

## 2017-11-30 MED ORDER — ONDANSETRON HCL 4 MG PO TABS: 4.0000 mg | ORAL_TABLET | Freq: Four times a day (QID) | ORAL | Status: DC | PRN

## 2017-11-30 MED ORDER — HEPARIN SODIUM (PORCINE) 5000 UNIT/ML IJ SOLN
5000.0000 [IU] | Freq: Three times a day (TID) | INTRAMUSCULAR | Status: DC
  Administered 2017-12-01 – 2017-12-02 (×3): 5000 [IU] via SUBCUTANEOUS
  Filled 2017-11-30 (×3): qty 1

## 2017-11-30 MED ORDER — ACETAMINOPHEN 650 MG RE SUPP: 650.0000 mg | Freq: Four times a day (QID) | RECTAL | Status: DC | PRN

## 2017-11-30 MED ORDER — DOCUSATE SODIUM 100 MG PO CAPS
100.0000 mg | ORAL_CAPSULE | Freq: Two times a day (BID) | ORAL | Status: DC
  Filled 2017-11-30: qty 1

## 2017-11-30 MED ORDER — BISACODYL 5 MG PO TBEC: 5.0000 mg | DELAYED_RELEASE_TABLET | Freq: Every day | ORAL | Status: DC | PRN

## 2017-11-30 MED ORDER — HYDROCODONE-ACETAMINOPHEN 5-325 MG PO TABS: 1.0000 | ORAL_TABLET | ORAL | Status: DC | PRN

## 2017-11-30 MED ORDER — CLOPIDOGREL BISULFATE 75 MG PO TABS
75.0000 mg | ORAL_TABLET | Freq: Every day | ORAL | Status: DC
  Administered 2017-12-01 – 2017-12-02 (×2): 75 mg via ORAL
  Filled 2017-11-30 (×2): qty 1

## 2017-11-30 NOTE — H&P (Signed)
[facility] [place] [facility]   PATIENT NAME: Shawn Shelton    MR#:  [id number]  DATE OF BIRTH:  1961-01-18  DATE OF ADMISSION:  11/30/2017  PRIMARY CARE PHYSICIAN: Evelene Croon, MD   REQUESTING/REFERRING PHYSICIAN:   CHIEF COMPLAINT:   Chief Complaint  Patient presents with  . Chest Pain    HISTORY OF PRESENT ILLNESS: Shawn Shelton  is a 57 y.o. male with a known history of chronic back pain, hyperlipidemia, hypertension and coronary artery disease, status post stent in 2013.  Patient is compliant with his medications, including aspirin, statin and Plavix. Patient presented to emergency room for intermittent chest pain going on for the past week or so.  The chest pain is located centrally, just above the epigastric area and is described as 7 out of 10 burning; is brought on by meals and is associated with burping and occasional nausea.  No radiation.  Patient has tried over-the-counter acid reflux medications with good results.  His chest pain has not been affected by exertion.  Earlier today, patient had similar central chest pain after eating some pretzel, but this time the pain radiated to the left upper extremity.  Patient also noted shortness of breath with exertion going on for the past few days.  For this reason patient presented to emergency room.   He is currently chest pain free. Blood test in the emergency room shows elevated troponin level is 0.19 and WBC at 12,000.  The reminder of the CBC and CMP are within normal limits.  EKG, reviewed by myself shows normal sinus rhythm heart rate 83, normal axis.  No acute ST-T changes.  Chest x-ray reviewed by myself, is unremarkable. Patient is admitted to rule out ACS.  PAST MEDICAL HISTORY:   Past Medical History:  Diagnosis Date  . Chronic back pain   . High cholesterol   . Hypertension     PAST SURGICAL HISTORY:  Past Surgical History:  Procedure Laterality Date  . BACK SURGERY    .  CORONARY ANGIOPLASTY WITH STENT PLACEMENT      SOCIAL HISTORY:  Social History   Tobacco Use  . Smoking status: Former Games developer  . Smokeless tobacco: Never Used  Substance Use Topics  . Alcohol use: No    FAMILY HISTORY: HTN and HL in both parents.  DRUG ALLERGIES: No Known Allergies  REVIEW OF SYSTEMS:   CONSTITUTIONAL: No fever, fatigue or weakness.  EYES: No blurred or double vision.  EARS, NOSE, AND THROAT: No tinnitus or ear pain.  RESPIRATORY: No cough, wheezing or hemoptysis.  Positive for shortness of breath with exertion. CARDIOVASCULAR: Positive for chest pain.  No orthopnea, edema.  GASTROINTESTINAL: No nausea, vomiting, diarrhea or abdominal pain.  GENITOURINARY: No dysuria, hematuria.  ENDOCRINE: No polyuria, nocturia,  HEMATOLOGY: No anemia, easy bruising or bleeding SKIN: No rash or lesion. MUSCULOSKELETAL: No joint pain.   NEUROLOGIC: No focal weakness.  PSYCHIATRY: No anxiety or depression.   MEDICATIONS AT HOME:  Prior to Admission medications   Medication Sig Start Date End Date Taking? Authorizing Provider  aspirin EC 325 MG tablet Take 325 mg by mouth daily.   Yes [provider]  atorvastatin (LIPITOR) 40 MG tablet Take 40 mg by mouth daily.   Yes [provider]  clopidogrel (PLAVIX) 75 MG tablet Take 75 mg by mouth daily.   Yes [provider]  lisinopril (PRINIVIL,ZESTRIL) 10 MG tablet Take 10 mg by mouth daily.   Yes [provider]  metoprolol tartrate (LOPRESSOR) 25 MG tablet Take 12.5 mg by mouth 2 (two) times daily.   Yes [provider]      PHYSICAL EXAMINATION:   VITAL SIGNS: Blood pressure 122/67, pulse 69, temperature 98.1 F (36.7 C), temperature source Oral, resp. rate 10, height  (1.854 m), weight 108.9 kg (240 lb), SpO2 96 %.  GENERAL:  57 y.o.-year-old patient lying in the bed with no acute distress, chest pain free at this time. EYES: Pupils equal, round, reactive to light and  accommodation. No scleral icterus. Extraocular muscles intact.  HEENT: Head atraumatic, normocephalic. Oropharynx and nasopharynx clear.  NECK:  Supple, no jugular venous distention. No thyroid enlargement, no tenderness.  LUNGS: Normal breath sounds bilaterally, no wheezing, rales,rhonchi or crepitation. No use of accessory muscles of respiration.  CARDIOVASCULAR: S1, S2 normal. No S3/S4.  ABDOMEN: Soft, nontender, nondistended. Bowel sounds present. No organomegaly or mass.  EXTREMITIES: No pedal edema, cyanosis, or clubbing.  NEUROLOGIC: No focal weakness.  PSYCHIATRIC: The patient is alert and oriented x 3.  SKIN: No obvious rash, lesion, or ulcer.   LABORATORY PANEL:   CBC Recent Labs  Lab 11/30/17 2005  WBC 12.0*  HGB 15.2  HCT 43.2  PLT 239  MCV 92.6  MCH 32.6  MCHC 35.2  RDW 13.5   ------------------------------------------------------------------------------------------------------------------  Chemistries  Recent Labs  Lab 11/30/17 2005  NA 139  K 4.2  CL 104  CO2 28  GLUCOSE 97  BUN 14  CREATININE 1.14  CALCIUM 10.1   ------------------------------------------------------------------------------------------------------------------ estimated creatinine clearance is 91.4 mL/min (by C-G formula based on SCr of 1.14 mg/dL). ------------------------------------------------------------------------------------------------------------------ No results for input(s): TSH, T4TOTAL, T3FREE, THYROIDAB in the last 72 hours.  Invalid input(s): FREET3   Coagulation profile No results for input(s): INR, PROTIME in the last 168 hours. ------------------------------------------------------------------------------------------------------------------- No results for input(s): DDIMER in the last 72 hours. -------------------------------------------------------------------------------------------------------------------  Cardiac Enzymes Recent Labs  Lab 11/30/17 2005   TROPONINI 0.19*   ------------------------------------------------------------------------------------------------------------------ Invalid input(s): POCBNP  ---------------------------------------------------------------------------------------------------------------  Urinalysis    Component Value Date/Time   COLORURINE Yellow 06/13/2012 0620   COLORURINE YELLOW 10/27/2009 1725   APPEARANCEUR Clear 06/13/2012 0620   LABSPEC 1.009 06/13/2012 0620   PHURINE 6.0 06/13/2012 0620   PHURINE 7.0 10/27/2009 1725   GLUCOSEU Negative 06/13/2012 0620   HGBUR 1+ 06/13/2012 0620   HGBUR NEGATIVE 10/27/2009 1725   BILIRUBINUR Negative 06/13/2012 0620   KETONESUR Negative 06/13/2012 0620   KETONESUR NEGATIVE 10/27/2009 1725   PROTEINUR Negative 06/13/2012 0620   PROTEINUR NEGATIVE 10/27/2009 1725   UROBILINOGEN 1.0 10/27/2009 1725   NITRITE Negative 06/13/2012 0620   NITRITE NEGATIVE 10/27/2009 1725   LEUKOCYTESUR Trace 06/13/2012 0620     RADIOLOGY: Dg Chest 2 View  Result Date: 11/30/2017 CLINICAL DATA:  Chest pain EXAM: CHEST - 2 VIEW COMPARISON:  02/17/2016 FINDINGS: Hyperinflation with mild bronchitic changes. No acute opacity or pleural effusion. Normal heart size. Aortic atherosclerosis. No pneumothorax. IMPRESSION: No active cardiopulmonary disease. Electronically Signed   By: Jasmine Pang M.D.   On: 11/30/2017 20:40    EKG: Orders placed or performed during the hospital encounter of 11/30/17  . ED EKG within 10 minutes  . ED EKG within 10 minutes    IMPRESSION AND PLAN:   1. NSTEMI.  First troponin level is 0.19.  Patient is currently chest pain free.  Will continue aspirin, Plavix and statin.  Continue to monitor on telemetry and follow the troponin levels.  Will check 2D echo.  Cardiology is consulted for further evaluation and treatment. 2.  Chest pain, will rule out ACS, as discussed above, and are #1. 3. GERD, will start Protonix.  4.  CAD, status post stent  placement in 2013.  Continue aspirin, Plavix and statin therapy.  5.  Hypertension, controlled on metoprolol. 6.  Hyperlipidemia, on statin.  All the records are reviewed and case discussed with ED provider. Management plans discussed with the patient and he is in agreement.  CODE STATUS: FULL    TOTAL TIME TAKING CARE OF THIS PATIENT: 45 minutes.    Cammy Copa M.D on 11/30/2017 at 10:48 PM  Between 7am to 6pm - Pager - [phone]  After 6pm go to [email or web address] - password [email or web address] [facility]  [place] [facility]  Office  [phone]  CC: Primary care physician; Evelene Croon, MD

## 2017-11-30 NOTE — ED Notes (Signed)
Patient transported to [id number] 

## 2017-11-30 NOTE — ED Triage Notes (Signed)
Patient ambulatory to triage with steady gait, without difficulty or distress noted; pt reports frequent belching last month; today after eating pretzels began having mid chest pain radiating into left arm; st hx of same with card stent placement; st "always has this pain but has gotten worse"

## 2017-11-30 NOTE — ED Provider Notes (Signed)
[facility] Emergency Department Provider Note  ____________________________________________  Time seen: Approximately 9:11 PM  I have reviewed the triage vital signs and the nursing notes.   HISTORY  Chief Complaint Chest Pain    HPI Shawn Shelton is a 57 y.o. male with a history of CAD that is post stent on a full aspirin daily, HTN, HL, presenting with chest pain.  The patient reports that for the past week, he has had a central chest pain described as a "burning."  It is worse with eating foods and the patient felt like it was most likely indigestion.  He had a lot of associated burping.  He has associated nausea without vomiting.  Patient has also noted that he has some exertional shortness of breath.  Exertion does not bring on his chest pain, however.  Today, the patient ate some pretzels and he had similar central chest pain, but also began to have pain radiating into the left upper extremity.  The patient denies any cough or cold symptoms.  He took his full 325 mg aspirin today.  At this time, the patient is completely asymptomatic.  Past Medical History:  Diagnosis Date  . Chronic back pain   . High cholesterol   . Hypertension     There are no active problems to display for this patient.   Past Surgical History:  Procedure Laterality Date  . BACK SURGERY    . CORONARY ANGIOPLASTY WITH STENT PLACEMENT      Current Outpatient Rx  . Order #: [id number] Class: Historical Med  . Order #: [id number] Class: Historical Med  . Order #: [id number] Class: Historical Med  . Order #: [id number] Class: Print  . Order #: [id number] Class: Historical Med  . Order #: [id number] Class: Print  . Order #: [id number] Class: Historical Med    Allergies Patient has no known allergies.  No family history on file.  Social History Social History   Tobacco Use  . Smoking status: Former Games developer  . Smokeless tobacco: Never Used  Substance Use Topics  . Alcohol use: No   . Drug use: Not on file    Review of Systems Constitutional: No fever/chills.  Headedness or syncope.  No diaphoresis. Eyes: No visual changes. ENT: No sore throat. No congestion or rhinorrhea. Cardiovascular: Positive central chest pain. Denies palpitations. Respiratory: Positive exertional shortness of breath.  No cough. Gastrointestinal: No abdominal pain.  Positive nausea, no vomiting.  No diarrhea.  No constipation. Genitourinary: Negative for dysuria. Musculoskeletal: Negative for back pain.  No new lower extremity swelling or pain.  No calf pain. Skin: Negative for rash. Neurological: Negative for headaches. No focal numbness, tingling or weakness.     ____________________________________________   PHYSICAL EXAM:  VITAL SIGNS: ED Triage Vitals  Enc Vitals Group     BP 11/30/17 2003 114/63     Pulse Rate 11/30/17 2003 70     Resp 11/30/17 2003 18     Temp 11/30/17 2003 98.1 F (36.7 C)     Temp Source 11/30/17 2003 Oral     SpO2 11/30/17 2003 98 %     Weight 11/30/17 2004 240 lb (108.9 kg)     Height 11/30/17 2004  (1.854 m)     Head Circumference --      Peak Flow --      Pain Score 11/30/17 2003 7     Pain Loc --      Pain Edu? --      Excl. in GC? --  Constitutional: Alert and oriented. Well appearing and in no acute distress. Answers questions appropriately. Eyes: Conjunctivae are normal.  EOMI. No scleral icterus. Head: Atraumatic. Nose: No congestion/rhinnorhea. Mouth/Throat: Mucous membranes are moist.  Neck: No stridor.  Supple.  No JVD.  No meningismus. Cardiovascular: Normal rate, regular rhythm. No murmurs, rubs or gallops.  Respiratory: Normal respiratory effort.  No accessory muscle use or retractions. Lungs CTAB.  No wheezes, rales or ronchi. Gastrointestinal:  Soft, nontender and nondistended.  No guarding or rebound.  No peritoneal signs. Musculoskeletal: Minimal pitting symmetric LE edema to just above the ankles. No ttp in the  calves or palpable cords.  Negative Homan's sign. Neurologic:  A&Ox3.  Speech is clear.  Face and smile are symmetric.  EOMI.  Moves all extremities well. Skin:  Skin is warm, dry and intact. No rash noted. Psychiatric: Mood and affect are normal. Speech and behavior are normal.  Normal judgement.* ____________________________________________   LABS (all labs ordered are listed, but only abnormal results are displayed)  Labs Reviewed  CBC - Abnormal; Notable for the following components:      Result Value   WBC 12.0 (*)    All other components within normal limits  TROPONIN I - Abnormal; Notable for the following components:   Troponin I 0.19 (*)    All other components within normal limits  BASIC METABOLIC PANEL   ____________________________________________  EKG  ED ECG REPORT I, Rockne Menghini, the attending physician, personally viewed and interpreted this ECG.   Date: 11/30/2017  EKG Time: 2007  Rate: 83  Rhythm: normal sinus rhythm  Axis: normal  Intervals:none  ST&T Change: No STEMI  ____________________________________________  RADIOLOGY  Dg Chest 2 View  Result Date: 11/30/2017 CLINICAL DATA:  Chest pain EXAM: CHEST - 2 VIEW COMPARISON:  02/17/2016 FINDINGS: Hyperinflation with mild bronchitic changes. No acute opacity or pleural effusion. Normal heart size. Aortic atherosclerosis. No pneumothorax. IMPRESSION: No active cardiopulmonary disease. Electronically Signed   By: Jasmine Pang M.D.   On: 11/30/2017 20:40    ____________________________________________   PROCEDURES  Procedure(s) performed: None  Procedures  Critical Care performed: No ____________________________________________   INITIAL IMPRESSION / ASSESSMENT AND PLAN / ED COURSE  Pertinent labs & imaging results that were available during my care of the patient were reviewed by me and considered in my medical decision making (see chart for details).  57 y.o. male with a history  of CAD status post stent presenting with 1 week of central chest pain associated with burping and worse with food, now with exertional shortness of breath and radiation down the left arm.  Overall, the patient is hemodynamically stable.  His symptoms clinically are most consistent with reflux or GI cause, however, he has high risk for ACS or MI.  Today, his troponin is 0.19.  Acute heparinization is not indicated as the patient is currently chest pain-free and his EKG does not show ischemic changes.  We will plan to admit him to the hospital for continued evaluation and treatment.  I will give him Zofran for his nausea.  ____________________________________________  FINAL CLINICAL IMPRESSION(S) / ED DIAGNOSES  Final diagnoses:  NSTEMI (non-ST elevated myocardial infarction) (HCC)         NEW MEDICATIONS STARTED DURING THIS VISIT:  New Prescriptions   No medications on file      Rockne Menghini, MD 11/30/17 2116

## 2017-12-01 ENCOUNTER — Inpatient Hospital Stay
Admit: 2017-12-01 | Discharge: 2017-12-01 | Disposition: A | Discharge status: Home / Self Care | Payer: 59 | Attending: Internal Medicine | Admitting: Internal Medicine

## 2017-12-01 ENCOUNTER — Encounter: Payer: Self-pay | Admitting: Emergency Medicine

## 2017-12-01 ENCOUNTER — Other Ambulatory Visit: Payer: Self-pay

## 2017-12-01 ENCOUNTER — Encounter
Admission: EM | Disposition: A | Discharge status: Home / Self Care | Payer: Self-pay | Source: Home / Self Care | Attending: Family Medicine

## 2017-12-01 HISTORY — PX: LEFT HEART CATH AND CORONARY ANGIOGRAPHY: CATH118249

## 2017-12-01 HISTORY — PX: CORONARY STENT INTERVENTION: CATH118234

## 2017-12-01 LAB — BASIC METABOLIC PANEL
ANION GAP: 5 (ref 5–15)
BUN: 17 mg/dL (ref 6–20)
CHLORIDE: 105 mmol/L (ref 101–111)
CO2: 30 mmol/L (ref 22–32)
Calcium: 9.2 mg/dL (ref 8.9–10.3)
Creatinine, Ser: 1.19 mg/dL (ref 0.61–1.24)
GFR calc non Af Amer: >60 mL/min (ref >60)
Glucose, Bld: 93 mg/dL (ref 65–99)
POTASSIUM: 3.8 mmol/L (ref 3.5–5.1)
SODIUM: 140 mmol/L (ref 135–145)

## 2017-12-01 LAB — CBC
HEMATOCRIT: 41.1 % (ref 40.0–52.0)
Hemoglobin: 14.3 g/dL (ref 13.0–18.0)
MCH: 32.8 pg (ref 26.0–34.0)
MCHC: 34.8 g/dL (ref 32.0–36.0)
MCV: 94.2 fL (ref 80.0–100.0)
Platelets: 194 10*3/uL (ref 150–440)
RBC: 4.36 MIL/uL — AB (ref 4.40–5.90)
RDW: 13.3 % (ref 11.5–14.5)
WBC: 9.9 10*3/uL (ref 3.8–10.6)

## 2017-12-01 LAB — PROTIME-INR
INR: 1.03
PROTHROMBIN TIME: 13.4 s (ref 11.4–15.2)

## 2017-12-01 LAB — GLUCOSE, CAPILLARY: GLUCOSE-CAPILLARY: 92 mg/dL (ref 65–99)

## 2017-12-01 LAB — POCT ACTIVATED CLOTTING TIME
Activated Clotting Time: 197 seconds
Activated Clotting Time: 230 seconds
Activated Clotting Time: 257 seconds

## 2017-12-01 LAB — TROPONIN I
Troponin I: 0.28 ng/mL (ref <0.03)
Troponin I: 0.26 ng/mL (ref <0.03)

## 2017-12-01 SURGERY — LEFT HEART CATH AND CORONARY ANGIOGRAPHY
Anesthesia: Moderate Sedation

## 2017-12-01 MED ORDER — SODIUM CHLORIDE 0.9% FLUSH: 3.0000 mL | Freq: Two times a day (BID) | INTRAVENOUS | Status: DC

## 2017-12-01 MED ORDER — SODIUM CHLORIDE 0.9% FLUSH
3.0000 mL | Freq: Two times a day (BID) | INTRAVENOUS | Status: DC
  Administered 2017-12-01 – 2017-12-02 (×2): 3 mL via INTRAVENOUS

## 2017-12-01 MED ORDER — VERAPAMIL HCL 2.5 MG/ML IV SOLN
INTRAVENOUS | Status: AC
Start: 2017-12-01 — End: ?
  Filled 2017-12-01: qty 2

## 2017-12-01 MED ORDER — SODIUM CHLORIDE 0.9 % WEIGHT BASED INFUSION: 1.0000 mL/kg/h | INTRAVENOUS | Status: AC

## 2017-12-01 MED ORDER — CLOPIDOGREL BISULFATE 75 MG PO TABS: 75.0000 mg | ORAL_TABLET | Freq: Every day | ORAL | Status: DC

## 2017-12-01 MED ORDER — ASPIRIN 81 MG PO CHEW: 81.0000 mg | CHEWABLE_TABLET | Freq: Every day | ORAL | Status: DC

## 2017-12-01 MED ORDER — CLOPIDOGREL BISULFATE 75 MG PO TABS
ORAL_TABLET | ORAL | Status: AC
  Filled 2017-12-01: qty 3

## 2017-12-01 MED ORDER — SODIUM CHLORIDE 0.9 % WEIGHT BASED INFUSION: 1.0000 mL/kg/h | INTRAVENOUS | Status: DC

## 2017-12-01 MED ORDER — SODIUM CHLORIDE 0.9 % WEIGHT BASED INFUSION
3.0000 mL/kg/h | INTRAVENOUS | Status: AC
  Administered 2017-12-01: 3 mL/kg/h via INTRAVENOUS

## 2017-12-01 MED ORDER — NITROGLYCERIN 1 MG/10 ML FOR IR/CATH LAB
INTRA_ARTERIAL | Status: DC | PRN
  Administered 2017-12-01 (×2): 200 ug via INTRACORONARY

## 2017-12-01 MED ORDER — SODIUM CHLORIDE 0.9 % IV SOLN
250.0000 mL | INTRAVENOUS | Status: DC | PRN
Start: 2017-12-01 — End: 2017-12-02

## 2017-12-01 MED ORDER — FENTANYL CITRATE (PF) 100 MCG/2ML IJ SOLN
INTRAMUSCULAR | Status: AC
  Filled 2017-12-01: qty 2

## 2017-12-01 MED ORDER — SODIUM CHLORIDE 0.9 % IV SOLN: 250.0000 mL | INTRAVENOUS | Status: DC | PRN

## 2017-12-01 MED ORDER — HEPARIN SODIUM (PORCINE) 1000 UNIT/ML IJ SOLN
INTRAMUSCULAR | Status: AC
  Filled 2017-12-01: qty 1

## 2017-12-01 MED ORDER — SODIUM CHLORIDE 0.9% FLUSH: 3.0000 mL | INTRAVENOUS | Status: DC | PRN

## 2017-12-01 MED ORDER — MIDAZOLAM HCL 2 MG/2ML IJ SOLN
INTRAMUSCULAR | Status: AC
  Filled 2017-12-01: qty 2

## 2017-12-01 MED ORDER — IOPAMIDOL (ISOVUE-300) INJECTION 61%
INTRAVENOUS | Status: DC | PRN
  Administered 2017-12-01: 315 mL via INTRAVENOUS

## 2017-12-01 MED ORDER — NITROGLYCERIN 5 MG/ML IV SOLN
INTRAVENOUS | Status: AC
  Filled 2017-12-01: qty 10

## 2017-12-01 MED ORDER — HEPARIN (PORCINE) IN NACL 1000-0.9 UT/500ML-% IV SOLN
INTRAVENOUS | Status: AC
  Filled 2017-12-01: qty 500

## 2017-12-01 MED ORDER — LIDOCAINE HCL (PF) 1 % IJ SOLN
INTRAMUSCULAR | Status: AC
Start: 2017-12-01 — End: ?
  Filled 2017-12-01: qty 30

## 2017-12-01 MED ORDER — ACETAMINOPHEN 325 MG PO TABS: 650.0000 mg | ORAL_TABLET | ORAL | Status: DC | PRN

## 2017-12-01 MED ORDER — ONDANSETRON HCL 4 MG/2ML IJ SOLN: 4.0000 mg | Freq: Four times a day (QID) | INTRAMUSCULAR | Status: DC | PRN

## 2017-12-01 MED ORDER — HYDRALAZINE HCL 20 MG/ML IJ SOLN: 5.0000 mg | INTRAMUSCULAR | Status: AC | PRN

## 2017-12-01 MED ORDER — CLOPIDOGREL BISULFATE 75 MG PO TABS
ORAL_TABLET | ORAL | Status: DC | PRN
  Administered 2017-12-01: 225 mg via ORAL

## 2017-12-01 MED ORDER — VERAPAMIL HCL 2.5 MG/ML IV SOLN
INTRAVENOUS | Status: DC | PRN
  Administered 2017-12-01: 2.5 mg via INTRA_ARTERIAL

## 2017-12-01 MED ORDER — PANTOPRAZOLE SODIUM 40 MG PO TBEC
40.0000 mg | DELAYED_RELEASE_TABLET | Freq: Every day | ORAL | Status: DC
  Administered 2017-12-01 – 2017-12-02 (×2): 40 mg via ORAL
  Filled 2017-12-01 (×2): qty 1

## 2017-12-01 MED ORDER — FENTANYL CITRATE (PF) 100 MCG/2ML IJ SOLN
INTRAMUSCULAR | Status: DC | PRN
  Administered 2017-12-01: 50 ug via INTRAVENOUS

## 2017-12-01 MED ORDER — ASPIRIN 81 MG PO CHEW: 81.0000 mg | CHEWABLE_TABLET | ORAL | Status: DC

## 2017-12-01 MED ORDER — LABETALOL HCL 5 MG/ML IV SOLN: 10.0000 mg | INTRAVENOUS | Status: AC | PRN

## 2017-12-01 MED ORDER — SODIUM CHLORIDE 0.9 % IV SOLN
INTRAVENOUS | Status: AC | PRN
  Administered 2017-12-01: 500 mL via INTRAVENOUS

## 2017-12-01 MED ORDER — HEPARIN SODIUM (PORCINE) 1000 UNIT/ML IJ SOLN
INTRAMUSCULAR | Status: DC | PRN
  Administered 2017-12-01: 3000 [IU] via INTRAVENOUS
  Administered 2017-12-01: 4000 [IU] via INTRAVENOUS
  Administered 2017-12-01: 5000 [IU] via INTRAVENOUS

## 2017-12-01 MED ORDER — MIDAZOLAM HCL 2 MG/2ML IJ SOLN
INTRAMUSCULAR | Status: DC | PRN
  Administered 2017-12-01: 1 mg via INTRAVENOUS

## 2017-12-01 SURGICAL SUPPLY — 18 items
BALLN TREK RX 2.5X15 (BALLOONS) ×3
BALLOON TREK RX 2.5X15 (BALLOONS) IMPLANT
CATH INFINITI 5FR ANG PIGTAIL (CATHETERS) ×2 IMPLANT
CATH OPTITORQUE TIG 4.5 5F (CATHETERS) ×2 IMPLANT
CATH VISTA GUIDE 6FR XB3.5 (CATHETERS) ×2 IMPLANT
DEVICE INFLAT 30 PLUS (MISCELLANEOUS) ×2 IMPLANT
DEVICE RAD COMP TR BAND LRG (VASCULAR PRODUCTS) ×2 IMPLANT
GLIDESHEATH SLEND A-KIT 6F 22G (SHEATH) IMPLANT
KIT MANI 3VAL PERCEP (MISCELLANEOUS) ×3 IMPLANT
NDL PERC 21GX4CM (NEEDLE) IMPLANT
NEEDLE PERC 21GX4CM (NEEDLE) ×3 IMPLANT
PACK CARDIAC CATH (CUSTOM PROCEDURE TRAY) ×3 IMPLANT
SHEATH RAIN 4/5FR (SHEATH) IMPLANT
SHEATH RAIN RADIAL 21G 6FR (SHEATH) ×2 IMPLANT
STENT SIERRA 2.50 X 15 MM (Permanent Stent) ×2 IMPLANT
WIRE ASAHI PROWATER 180CM (WIRE) ×4 IMPLANT
WIRE HITORQ VERSACORE ST 145CM (WIRE) ×2 IMPLANT
WIRE ROSEN-J .035X260CM (WIRE) ×2 IMPLANT

## 2017-12-01 NOTE — Progress Notes (Signed)
Troponin value paged to on call MD.

## 2017-12-01 NOTE — Consult Note (Signed)
[facility] Cardiology  CARDIOLOGY CONSULT NOTE  Patient ID: Shawn Shelton MRN: [id number] DOB/AGE: Jul 01, 1961 57 y.o.  Admit date: 11/30/2017 Referring Physician Casper Harrison Primary Physician [place] Primary Cardiologist Fath Reason for Consultation non-ST elevation myocardial infarction  HPI: 57 year old gentleman referred for evaluation of non-ST elevation myocardial infarction.  The patient has known coronary disease, status post coronary stent 2013.  Patient presents to [facility] emergency room with 1 week history of intermittent substernal chest pain which occurred with and without exertion.  In the emergency room, ECG revealed sinus rhythm without acute ischemic ST-T wave changes.  Admission labs were notable for elevated troponin of 0.19, 0.28 and 0.26.  Review of systems complete and found to be negative unless listed above     Past Medical History:  Diagnosis Date  . Chronic back pain   . High cholesterol   . Hypertension     Past Surgical History:  Procedure Laterality Date  . BACK SURGERY    . CORONARY ANGIOPLASTY WITH STENT PLACEMENT      Medications Prior to Admission  Medication Sig Dispense Refill Last Dose  . aspirin EC 325 MG tablet Take 325 mg by mouth daily.   11/30/2017 at 0800  . atorvastatin (LIPITOR) 40 MG tablet Take 40 mg by mouth daily.   11/30/2017 at 1600  . clopidogrel (PLAVIX) 75 MG tablet Take 75 mg by mouth daily.   11/30/2017 at 0800  . lisinopril (PRINIVIL,ZESTRIL) 10 MG tablet Take 10 mg by mouth daily.   11/30/2017 at 0800  . metoprolol tartrate (LOPRESSOR) 25 MG tablet Take 12.5 mg by mouth 2 (two) times daily.   11/30/2017 at 1600   Social History   Socioeconomic History  . Marital status: Married    Spouse name: Not on file  . Number of children: Not on file  . Years of education: Not on file  . Highest education level: Not on file  Occupational History  . Not on file  Social Needs  . Financial resource strain: Not on file  . Food insecurity:     Worry: Not on file    Inability: Not on file  . Transportation needs:    Medical: Not on file    Non-medical: Not on file  Tobacco Use  . Smoking status: Former Games developer  . Smokeless tobacco: Never Used  Substance and Sexual Activity  . Alcohol use: No  . Drug use: Not on file  . Sexual activity: Not on file  Lifestyle  . Physical activity:    Days per week: Not on file    Minutes per session: Not on file  . Stress: Not on file  Relationships  . Social connections:    Talks on phone: Not on file    Gets together: Not on file    Attends religious service: Not on file    Active member of club or organization: Not on file    Attends meetings of clubs or organizations: Not on file    Relationship status: Not on file  . Intimate partner violence:    Fear of current or ex partner: Not on file    Emotionally abused: Not on file    Physically abused: Not on file    Forced sexual activity: Not on file  Other Topics Concern  . Not on file  Social History Narrative  . Not on file    No family history on file.    Review of systems complete and found to be negative unless listed above  PHYSICAL EXAM  General: Well developed, well nourished, in no acute distress HEENT:  Normocephalic and atramatic Neck:  No JVD.  Lungs: Clear bilaterally to auscultation and percussion. Heart: HRRR . Normal S1 and S2 without gallops or murmurs.  Abdomen: Bowel sounds are positive, abdomen soft and non-tender  Msk:  Back normal, normal gait. Normal strength and tone for age. Extremities: No clubbing, cyanosis or edema.   Neuro: Alert and oriented X 3. Psych:  Good affect, responds appropriately  Labs:   Lab Results  Component Value Date   WBC 9.9 12/01/2017   HGB 14.3 12/01/2017   HCT 41.1 12/01/2017   MCV 94.2 12/01/2017   PLT 194 12/01/2017    Recent Labs  Lab 12/01/17 0422  NA 140  K 3.8  CL 105  CO2 30  BUN 17  CREATININE 1.19  CALCIUM 9.2  GLUCOSE 93   Lab Results   Component Value Date   CKTOTAL 144 06/13/2012   CKMB 7.1 (H) 06/13/2012   TROPONINI 0.26 (HH) 12/01/2017    Lab Results  Component Value Date   CHOL 218 (H) 06/13/2012   Lab Results  Component Value Date   HDL 26 (L) 06/13/2012   Lab Results  Component Value Date   LDLCALC 144 (H) 06/13/2012   Lab Results  Component Value Date   TRIG 241 (H) 06/13/2012   No results found for: CHOLHDL No results found for: LDLDIRECT    Radiology: Dg Chest 2 View  Result Date: 11/30/2017 CLINICAL DATA:  Chest pain EXAM: CHEST - 2 VIEW COMPARISON:  02/17/2016 FINDINGS: Hyperinflation with mild bronchitic changes. No acute opacity or pleural effusion. Normal heart size. Aortic atherosclerosis. No pneumothorax. IMPRESSION: No active cardiopulmonary disease. Electronically Signed   By: Jasmine Pang M.D.   On: 11/30/2017 20:40    EKG: Sinus rhythm  ASSESSMENT AND PLAN:   1.  Non-ST elevation myocardial infarction, in patient with known coronary disease, status post coronary stent 2013  Recommendations  1.  Agree with current therapy 2.  Proceed with cardiac catheterization with selective coronary arteriography.  The risks, benefits alternatives cardiac catheterization and possible PCI were explained to the patient and informed consent was obtained.  Signed: Marcina Millard MD,PhD, [facility] 12/01/2017, 9:22 AM

## 2017-12-01 NOTE — Progress Notes (Signed)
[facility] [place] at [facility]   PATIENT NAME: Shawn Shelton    MR#:  [id number]  DATE OF BIRTH:  11-04-1960  SUBJECTIVE: Seen at bedside, patient admitted for chest pain, status post cardiac cath had 95% stenosis in left circumflex, status post stent.  Likely discharge home tomorrow.  CHIEF COMPLAINT:   Chief Complaint  Patient presents with  . Chest Pain    REVIEW OF SYSTEMS:   ROS CONSTITUTIONAL: No fever, fatigue or weakness.  EYES: No blurred or double vision.  EARS, NOSE, AND THROAT: No tinnitus or ear pain.  RESPIRATORY: No cough, shortness of breath, wheezing or hemoptysis.  CARDIOVASCULAR:  chest pain yesterday, history of stent placement 3 years ago.Marland Kitchen  GASTROINTESTINAL: No nausea, vomiting, diarrhea or abdominal pain.  GENITOURINARY: No dysuria, hematuria.  ENDOCRINE: No polyuria, nocturia,  HEMATOLOGY: No anemia, easy bruising or bleeding SKIN: No rash or lesion. MUSCULOSKELETAL: No joint pain or arthritis.   NEUROLOGIC: No tingling, numbness, weakness.  PSYCHIATRY: No anxiety or depression.  Patient had chest pain yesterday.  DRUG ALLERGIES:  No Known Allergies  VITALS:  Blood pressure 114/68, pulse 60, temperature 97.8 F (36.6 C), temperature source Oral, resp. rate 17, height  (1.854 m), weight 103.4 kg (228 lb), SpO2 94 %.  PHYSICAL EXAMINATION:  GENERAL:  57 y.o.-year-old patient lying in the bed with no acute distress.  EYES: Pupils equal, round, reactive to light and accommodation. No scleral icterus. Extraocular muscles intact.  HEENT: Head atraumatic, normocephalic. Oropharynx and nasopharynx clear.  NECK:  Supple, no jugular venous distention. No thyroid enlargement, no tenderness.  LUNGS: Normal breath sounds bilaterally, no wheezing, rales,rhonchi or crepitation. No use of accessory muscles of respiration.  CARDIOVASCULAR: S1, S2 normal. No murmurs, rubs, or gallops.  ABDOMEN: Soft, nontender, nondistended. Bowel  sounds present. No organomegaly or mass.  EXTREMITIES: No pedal edema, cyanosis, or clubbing.  NEUROLOGIC: Cranial nerves II through XII are intact. Muscle strength 5/5 in all extremities. Sensation intact. Gait not checked.  PSYCHIATRIC: The patient is alert and oriented x 3.  SKIN: No obvious rash, lesion, or ulcer.    LABORATORY PANEL:   CBC Recent Labs  Lab 12/01/17 0422  WBC 9.9  HGB 14.3  HCT 41.1  PLT 194   ------------------------------------------------------------------------------------------------------------------  Chemistries  Recent Labs  Lab 12/01/17 0422  NA 140  K 3.8  CL 105  CO2 30  GLUCOSE 93  BUN 17  CREATININE 1.19  CALCIUM 9.2   ------------------------------------------------------------------------------------------------------------------  Cardiac Enzymes Recent Labs  Lab 12/01/17 0718  TROPONINI 0.26*   ------------------------------------------------------------------------------------------------------------------  RADIOLOGY:  Dg Chest 2 View  Result Date: 11/30/2017 CLINICAL DATA:  Chest pain EXAM: CHEST - 2 VIEW COMPARISON:  02/17/2016 FINDINGS: Hyperinflation with mild bronchitic changes. No acute opacity or pleural effusion. Normal heart size. Aortic atherosclerosis. No pneumothorax. IMPRESSION: No active cardiopulmonary disease. Electronically Signed   By: Jasmine Pang M.D.   On: 11/30/2017 20:40    EKG:   Orders placed or performed during the hospital encounter of 11/30/17  . ED EKG within 10 minutes  . ED EKG within 10 minutes  . EKG 12-Lead  . EKG 12-Lead  . EKG 12-Lead immediately post procedure  . EKG 12-Lead  . EKG 12-Lead  . EKG 12-Lead  . EKG 12-Lead immediately post procedure    ASSESSMENT AND PLAN:  #1 chest pain,Non-ST elevation MI: Status post cardiac cath showing 94% stenosis of left circumflex status post drug-eluting stent placed, continue aspirin, high intensity  statins, Plavix, beta-blockers, ACE  inhibitors.  Likely discharge home tomorrow, monitor on telemetry today for any arrhythmias. 2.  GI, DVT prophylaxis. 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     All the records are reviewed and case discussed with Care Management/Social Workerr. Management plans discussed with the patient, family and they are in agreement.  CODE STATUS: full  TOTAL TIME TAKING CARE OF THIS PATIENT:35 minutes.   POSSIBLE D/C IN 1-2 DAYS, DEPENDING ON CLINICAL CONDITION.   Katha Hamming M.D on 12/01/2017 at 5:31 PM  Between 7am to 6pm - Pager - [phone]  After 6pm go to [email or web address] - password [email or web address]  [facility]  Office  [phone]  CC: Primary care physician; Evelene Croon, MD   Note: This dictation was prepared with Dragon dictation along with smaller phrase technology. Any transcriptional errors that result from this process are unintentional.

## 2017-12-02 ENCOUNTER — Encounter: Payer: Self-pay | Admitting: Cardiology

## 2017-12-02 LAB — CBC
HCT: 38.3 % — ABNORMAL LOW (ref 40.0–52.0)
Hemoglobin: 13.4 g/dL (ref 13.0–18.0)
MCH: 32.9 pg (ref 26.0–34.0)
MCHC: 35.1 g/dL (ref 32.0–36.0)
MCV: 93.8 fL (ref 80.0–100.0)
PLATELETS: 170 10*3/uL (ref 150–440)
RBC: 4.08 MIL/uL — ABNORMAL LOW (ref 4.40–5.90)
RDW: 13.8 % (ref 11.5–14.5)
WBC: 8.3 10*3/uL (ref 3.8–10.6)

## 2017-12-02 LAB — BASIC METABOLIC PANEL
Anion gap: 6 (ref 5–15)
BUN: 15 mg/dL (ref 6–20)
CALCIUM: 8.4 mg/dL — AB (ref 8.9–10.3)
CO2: 24 mmol/L (ref 22–32)
Chloride: 105 mmol/L (ref 101–111)
Creatinine, Ser: 0.8 mg/dL (ref 0.61–1.24)
GFR calc Af Amer: >60 mL/min (ref >60)
GLUCOSE: 85 mg/dL (ref 65–99)
Potassium: 3.8 mmol/L (ref 3.5–5.1)
Sodium: 135 mmol/L (ref 135–145)

## 2017-12-02 LAB — HIV ANTIBODY (ROUTINE TESTING W REFLEX): HIV Screen 4th Generation wRfx: NONREACTIVE

## 2017-12-02 LAB — ECHOCARDIOGRAM COMPLETE
HEIGHTINCHES: 73 in
Weight: 3648 oz

## 2017-12-02 LAB — GLUCOSE, CAPILLARY: GLUCOSE-CAPILLARY: 87 mg/dL (ref 65–99)

## 2017-12-02 NOTE — Discharge Summary (Signed)
[facility] [place] [facility]   PATIENT NAME: Shawn Shelton    MR#:  [id number]  DATE OF BIRTH:  [birth year]  DATE OF ADMISSION:  11/30/2017 ADMITTING PHYSICIAN: Cammy Copa, MD  DATE OF DISCHARGE: No discharge date for patient encounter.  PRIMARY CARE PHYSICIAN: Evelene Croon, MD    ADMISSION DIAGNOSIS:  NSTEMI (non-ST elevated myocardial infarction) (HCC) [I21.4]  DISCHARGE DIAGNOSIS:  Active Problems:   NSTEMI (non-ST elevated myocardial infarction) (HCC)   SECONDARY DIAGNOSIS:   Past Medical History:  Diagnosis Date  . Chronic back pain   . High cholesterol   . Hypertension     HOSPITAL COURSE:  *Acute non-STEMI Resolved Status post cardiac cath showing 94% stenosis of left circumflex status post drug-eluting stent placed, continue aspirin, high intensity statins, Plavix, beta-blockers, ACE inhibitors, followed by cardiology while in house-recommended follow-up status post discharge 2 weeks with Dr. Lady Gary   DISCHARGE CONDITIONS:  Stable, discharged home with follow-up with cardiology and primary care provider  CONSULTS OBTAINED:  Treatment Team:  SalaryEvelena Asa, MD  DRUG ALLERGIES:  No Known Allergies  DISCHARGE MEDICATIONS:   Allergies as of 12/02/2017   No Known Allergies     Medication List    TAKE these medications   aspirin EC 325 MG tablet Take 325 mg by mouth daily.   atorvastatin 40 MG tablet Commonly known as:  LIPITOR Take 40 mg by mouth daily.   clopidogrel 75 MG tablet Commonly known as:  PLAVIX Take 75 mg by mouth daily.   lisinopril 10 MG tablet Commonly known as:  PRINIVIL,ZESTRIL Take 10 mg by mouth daily.   metoprolol tartrate 25 MG tablet Commonly known as:  LOPRESSOR Take 12.5 mg by mouth 2 (two) times daily.        DISCHARGE INSTRUCTIONS:   If you experience worsening of your admission symptoms, develop shortness of breath, life threatening emergency, suicidal or homicidal  thoughts you must seek medical attention immediately by calling 911 or calling your MD immediately  if symptoms less severe.  You Must read complete instructions/literature along with all the possible adverse reactions/side effects for all the Medicines you take and that have been prescribed to you. Take any new Medicines after you have completely understood and accept all the possible adverse reactions/side effects.   Please note  You were cared for by a hospitalist during your hospital stay. If you have any questions about your discharge medications or the care you received while you were in the hospital after you are discharged, you can call the unit and asked to speak with the hospitalist on call if the hospitalist that took care of you is not available. Once you are discharged, your primary care physician will handle any further medical issues. Please note that NO REFILLS for any discharge medications will be authorized once you are discharged, as it is imperative that you return to your primary care physician (or establish a relationship with a primary care physician if you do not have one) for your aftercare needs so that they can reassess your need for medications and monitor your lab values.    Today   CHIEF COMPLAINT:   Chief Complaint  Patient presents with  . Chest Pain    HISTORY OF PRESENT ILLNESS:  57 y.o. male with a known history of chronic back pain, hyperlipidemia, hypertension and coronary artery disease, status post stent in 2013.  Patient is compliant with his medications, including aspirin, statin and Plavix. Patient presented  to emergency room for intermittent chest pain going on for the past week or so.  The chest pain is located centrally, just above the epigastric area and is described as 7 out of 10 burning; is brought on by meals and is associated with burping and occasional nausea.  No radiation.  Patient has tried over-the-counter acid reflux medications with good  results.  His chest pain has not been affected by exertion.  Earlier today, patient had similar central chest pain after eating some pretzel, but this time the pain radiated to the left upper extremity.  Patient also noted shortness of breath with exertion going on for the past few days.  For this reason patient presented to emergency room.   He is currently chest pain free. Blood test in the emergency room shows elevated troponin level is 0.19 and WBC at 12,000.  The reminder of the CBC and CMP are within normal limits.  EKG, reviewed by myself shows normal sinus rhythm heart rate 83, normal axis.  No acute ST-T changes.  Chest x-ray reviewed by myself, is unremarkable. Patient is admitted to rule out ACS.     VITAL SIGNS:  Blood pressure 126/73, pulse 73, temperature 97.6 F (36.4 C), temperature source Oral, resp. rate 14, height  (1.854 m), weight 102.9 kg (226 lb 12.8 oz), SpO2 99 %.  I/O:    Intake/Output Summary (Last 24 hours) at 12/02/2017 1330 Last data filed at 12/02/2017 1121 Gross per 24 hour  Intake 2753.9 ml  Output 450 ml  Net 2303.9 ml    PHYSICAL EXAMINATION:  GENERAL:  57 y.o.-year-old patient lying in the bed with no acute distress.  EYES: Pupils equal, round, reactive to light and accommodation. No scleral icterus. Extraocular muscles intact.  HEENT: Head atraumatic, normocephalic. Oropharynx and nasopharynx clear.  NECK:  Supple, no jugular venous distention. No thyroid enlargement, no tenderness.  LUNGS: Normal breath sounds bilaterally, no wheezing, rales,rhonchi or crepitation. No use of accessory muscles of respiration.  CARDIOVASCULAR: S1, S2 normal. No murmurs, rubs, or gallops.  ABDOMEN: Soft, non-tender, non-distended. Bowel sounds present. No organomegaly or mass.  EXTREMITIES: No pedal edema, cyanosis, or clubbing.  NEUROLOGIC: Cranial nerves II through XII are intact. Muscle strength 5/5 in all extremities. Sensation intact. Gait not checked.   PSYCHIATRIC: The patient is alert and oriented x 3.  SKIN: No obvious rash, lesion, or ulcer.   DATA REVIEW:   CBC Recent Labs  Lab 12/02/17 0507  WBC 8.3  HGB 13.4  HCT 38.3*  PLT 170    Chemistries  Recent Labs  Lab 12/02/17 0507  NA 135  K 3.8  CL 105  CO2 24  GLUCOSE 85  BUN 15  CREATININE 0.80  CALCIUM 8.4*    Cardiac Enzymes Recent Labs  Lab 12/01/17 0718  TROPONINI 0.26*    Microbiology Results  No results found for this or any previous visit.  RADIOLOGY:  Dg Chest 2 View  Result Date: 11/30/2017 CLINICAL DATA:  Chest pain EXAM: CHEST - 2 VIEW COMPARISON:  02/17/2016 FINDINGS: Hyperinflation with mild bronchitic changes. No acute opacity or pleural effusion. Normal heart size. Aortic atherosclerosis. No pneumothorax. IMPRESSION: No active cardiopulmonary disease. Electronically Signed   By: Jasmine Pang M.D.   On: 11/30/2017 20:40    EKG:   Orders placed or performed during the hospital encounter of 11/30/17  . ED EKG within 10 minutes  . ED EKG within 10 minutes  . EKG 12-Lead immediately post procedure  .  EKG 12-Lead  . EKG 12-Lead  . EKG 12-Lead  . EKG 12-Lead immediately post procedure  . EKG 12-Lead      Management plans discussed with the patient, family and they are in agreement.  CODE STATUS:     Code Status Orders  (From admission, onward)        Start     Ordered   12/01/17 1411  Full code  Continuous     12/01/17 1410    Code Status History    Date Active Date Inactive Code Status Order ID Comments User Context   11/30/2017 2349 12/01/2017 1410 Full Code [id number]  Cammy Copa, MD ED      TOTAL TIME TAKING CARE OF THIS PATIENT: 45 minutes.    Evelena Asa Lenka Zhao M.D on 12/02/2017 at 1:30 PM  Between 7am to 6pm - Pager - [phone]  After 6pm go to [email or web address] - password [email or web address]  [facility]  Office  [phone]  CC: Primary care physician; Evelene Croon, MD   Note: This dictation  was prepared with Dragon dictation along with smaller phrase technology. Any transcriptional errors that result from this process are unintentional.

## 2017-12-02 NOTE — Progress Notes (Addendum)
Cardiovascular and Pulmonary Nurse Navigator Note:   57 year old male with known hx of CAD, s/p coronary stent 2013, HTN, HLD, chronic back pain.  .  Patient presented to the ER with 1 week history of intermittent substernal chest pain which occurred with and without exertion.  ECG in ER SR without acute ischemic ST-T wave changes.  Patient with elevated troponins.  Patient is a former smoker.    Procedures   CORONARY STENT INTERVENTION  LEFT HEART CATH AND CORONARY ANGIOGRAPHY  Conclusion     Mid Cx to Dist Cx lesion is 95% stenosed.  A drug-eluting stent was successfully placed using a STENT SIERRA 2.50 X 15 MM.  Post intervention, there is a 0% residual stenosis.   1.  One-vessel CAD with 95% in-stent stenosis distal left circumflex 2.  Mild reduced left ventricular function with apical hypokinesis and estimated LVEF 45 to 50% 3.  Successful PCI, with DES in-stent distal left circumflex    "Heart Attack Bouncing Back" booklet given and reviewed with patient and daughter. Discussed the definition of CAD. Reviewed the location CAD and where stents were placed. Patient has stent card. Explained the purpose of the stent card. Instructed patient to keep stent card in his wallet.   Discussed modifiable risk factors including controlling blood pressure, cholesterol, and blood sugar; following heart healthy diet; maintaining healthy weight; exercise; and smoking cessation, if applicable. ?Note: Patient is a former smoker.   Discussed cardiac medications including rationale for taking, mechanisms of action, and side effects. Stressed the importance of taking medications as prescribed.   Discussed emergency plan for heart attack symptoms. Patient verbalized understanding of need to call 911 and not to drive herself to ER if having cardiac symptoms / chest pain.  Patient informed this RN since his first stent that he has pain in his chest  - intermittent pain at times that is shooting pain or  just a "twinge" - that are false alarms.  Upon asking patient if he had been given NTG in the past.  Patient stated he still has the same bottle from 2013 that he has never opened.  This RN instructed patient to discard that bottle, as it has expired and would no longer be effective.  Patient stated that he feels comfortable in determining whether is pain / twinges / indigestion is cardiac in nature or not.    Heart healthy diet of low sodium, low fat, low cholesterol heart healthy diet discussed.  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 Information on diet provided.   Smoking:  Patient is a former smoker.    Exercise - Patient does not currently exercise.  Benefits of exercise discussed. Explained to patient Dr. Darrold Junker has referred him to outpatient Cardiac Rehab. Overview of the program provided.  Patient participated in Cardiac Rehab in 2013 but was unable to afford the co-pays. Patient stated he enjoyed participating in Cardiac Rehab.   Patient has different insurance now and is working for a different company.  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  CPT Codes provided to patient along with informational letter, class and orientation times, and Cardiac Rehab brochure.  Patient plans to check with his insurance company to see what the co-pays would be for Cardiac Rehab.  We did talk about a walking plan  that he could do during his stops while driving his truck.  We also discussed a pedometer or Fit Bit to monitor his steps.  Patient is agreeable for Cardiac Rehab to contact him in a week to see if he will be able to participate in Cardiac Rehab.     Army Melia, RN, BSN, [facility] Cardiovascular and Pulmonary Nurse Navigator

## 2017-12-02 NOTE — Progress Notes (Signed)
Patient given discharge instructions. Patient verbalized understanding with all questions answered. Both IV's taken out and tele monitor taken off. Patient requested to ambulate in family vehicle. Patient discharged in stable condition with no issues.

## 2017-12-02 NOTE — Plan of Care (Signed)

## 2017-12-02 NOTE — Progress Notes (Signed)
[facility] Cardiology  SUBJECTIVE: Patient laying in bed, denies chest pain   Vitals:   12/01/17 1809 12/01/17 2010 12/02/17 0412 12/02/17 0809  BP: 112/66 121/72 120/72 126/73  Pulse: 63 60 62 (!) 57  Resp:  Temp:  98.1 F (36.7 C) 97.9 F (36.6 C) 97.6 F (36.4 C)  TempSrc:  Oral Oral Oral  SpO2: 97% 98% 98% 99%  Weight:   102.9 kg (226 lb 12.8 oz)   Height:         Intake/Output Summary (Last 24 hours) at 12/02/2017 0846 Last data filed at 12/02/2017 0000 Gross per 24 hour  Intake 2510.9 ml  Output 825 ml  Net 1685.9 ml      PHYSICAL EXAM  General: Well developed, well nourished, in no acute distress HEENT:  Normocephalic and atramatic Neck:  No JVD.  Lungs: Clear bilaterally to auscultation and percussion. Heart: HRRR . Normal S1 and S2 without gallops or murmurs.  Abdomen: Bowel sounds are positive, abdomen soft and non-tender  Msk:  Back normal, normal gait. Normal strength and tone for age. Extremities: No clubbing, cyanosis or edema.   Neuro: Alert and oriented X 3. Psych:  Good affect, responds appropriately   LABS: Basic Metabolic Panel: Recent Labs    12/01/17 0422 12/02/17 0507  NA 140 135  K 3.8 3.8  CL 105 105  CO2 30 24  GLUCOSE 93 85  BUN 17 15  CREATININE 1.19 0.80  CALCIUM 9.2 8.4*   Liver Function Tests: No results for input(s): AST, ALT, ALKPHOS, BILITOT, PROT, ALBUMIN in the last 72 hours. No results for input(s): LIPASE, AMYLASE in the last 72 hours. CBC: Recent Labs    12/01/17 0422 12/02/17 0507  WBC 9.9 8.3  HGB 14.3 13.4  HCT 41.1 38.3*  MCV 94.2 93.8  PLT 194 170   Cardiac Enzymes: Recent Labs    11/30/17 2005 12/01/17 0422 12/01/17 0718  TROPONINI 0.19* 0.28* 0.26*   BNP: Invalid input(s): POCBNP D-Dimer: No results for input(s): DDIMER in the last 72 hours. Hemoglobin A1C: No results for input(s): HGBA1C in the last 72 hours. Fasting Lipid Panel: No results for input(s): CHOL, HDL, LDLCALC, TRIG,  CHOLHDL, LDLDIRECT in the last 72 hours. Thyroid Function Tests: No results for input(s): TSH, T4TOTAL, T3FREE, THYROIDAB in the last 72 hours.  Invalid input(s): FREET3 Anemia Panel: No results for input(s): VITAMINB12, FOLATE, FERRITIN, TIBC, IRON, RETICCTPCT in the last 72 hours.  Dg Chest 2 View  Result Date: 11/30/2017 CLINICAL DATA:  Chest pain EXAM: CHEST - 2 VIEW COMPARISON:  02/17/2016 FINDINGS: Hyperinflation with mild bronchitic changes. No acute opacity or pleural effusion. Normal heart size. Aortic atherosclerosis. No pneumothorax. IMPRESSION: No active cardiopulmonary disease. Electronically Signed   By: Jasmine Pang M.D.   On: 11/30/2017 20:40     Echo pending  TELEMETRY: Sinus rhythm:  ASSESSMENT AND PLAN:  Active Problems:   NSTEMI (non-ST elevated myocardial infarction) (HCC)    1.  Non-ST elevation myocardial infarction 2.  Status post DES in-stent restenosis distal left circumflex  Recommendations  1.  Agree with current therapy 2.  Discharge home today, low up Dr. Sheran Spine, MD, PhD, [facility] 12/02/2017 8:46 AM

## 2018-11-22 IMAGING — CR DG CHEST 2V
2 series · 2 of 2 positions shown · non-contrast
Comparison: 02/17/2016

CLINICAL DATA: Chest pain

EXAM:
CHEST - 2 VIEW

[chest pa]
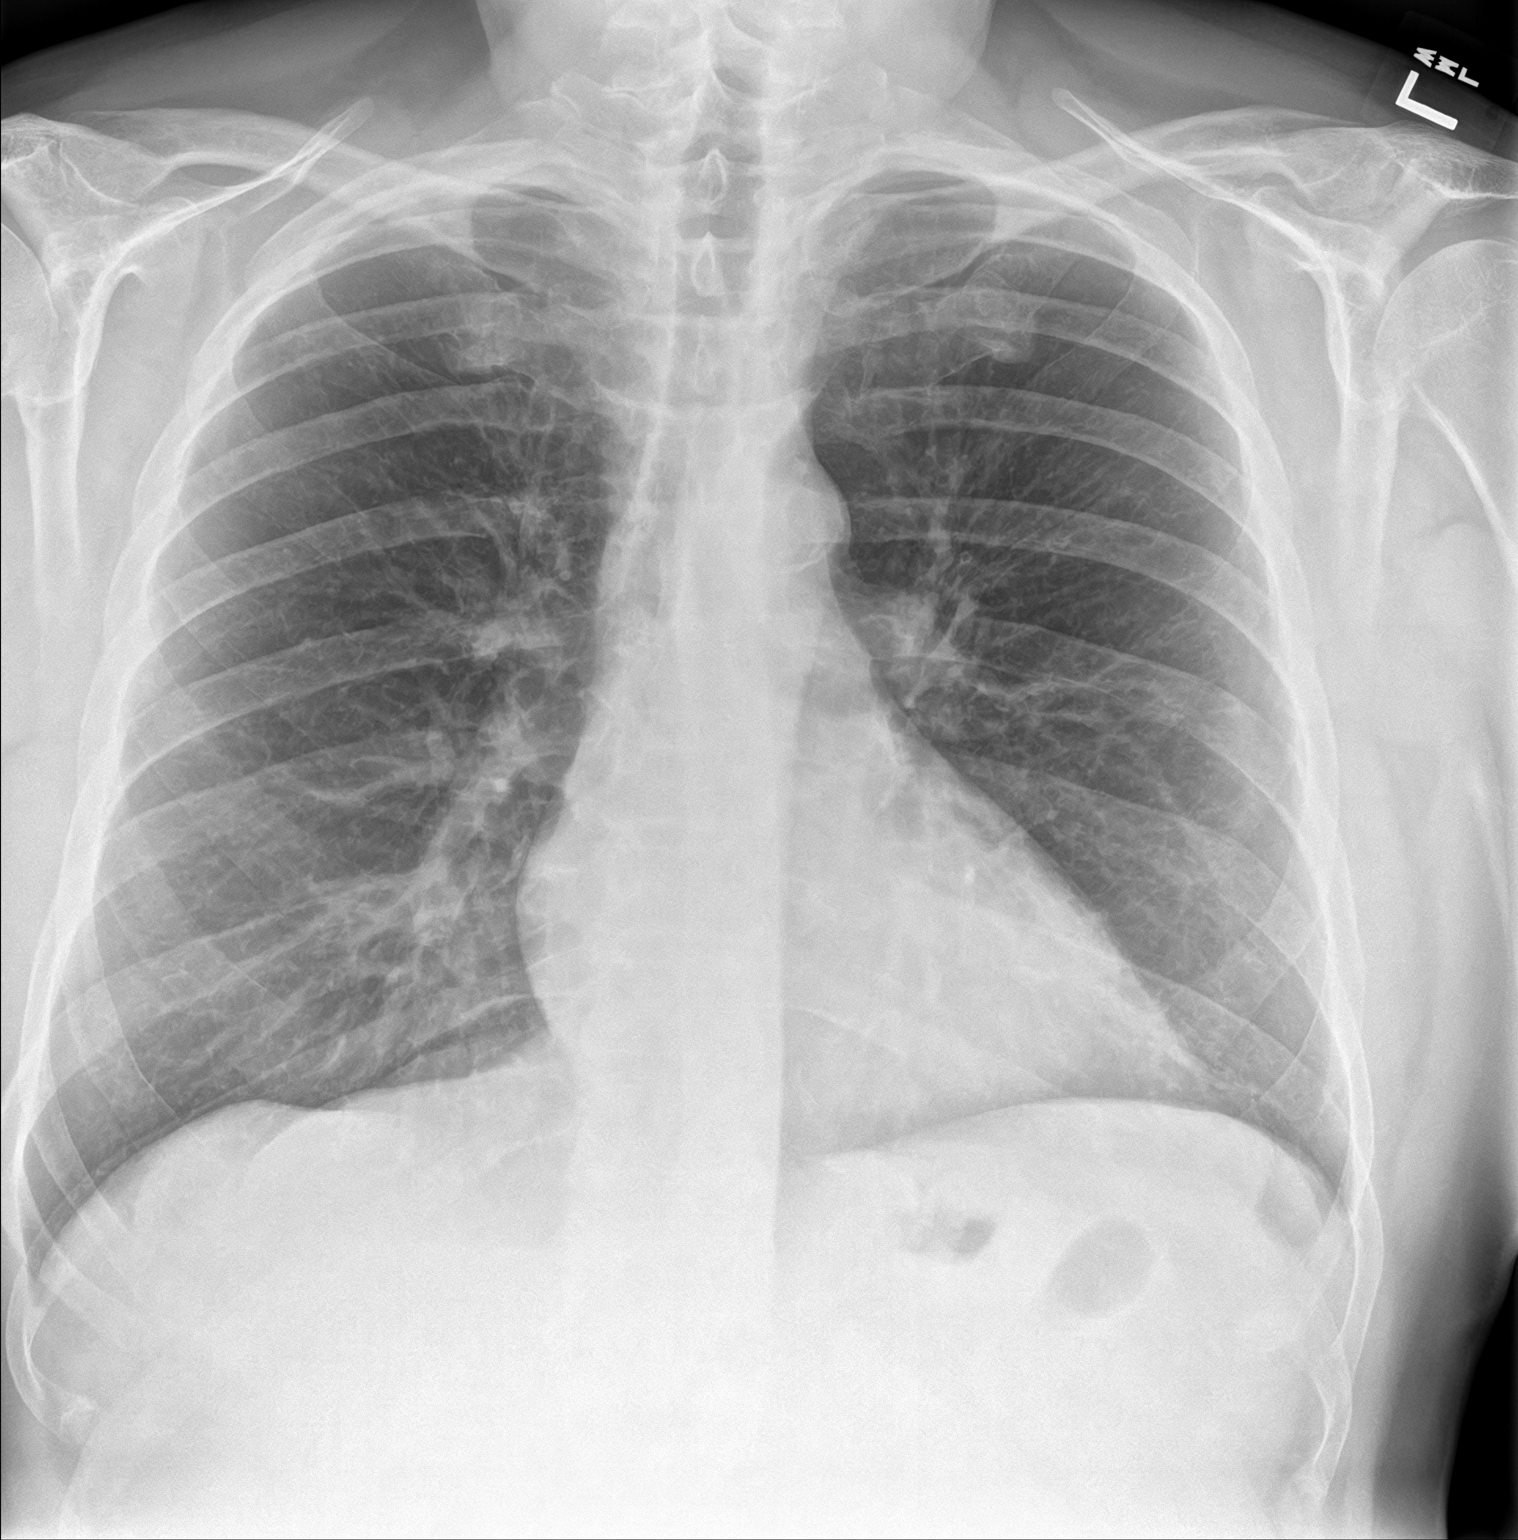

[chest lat]
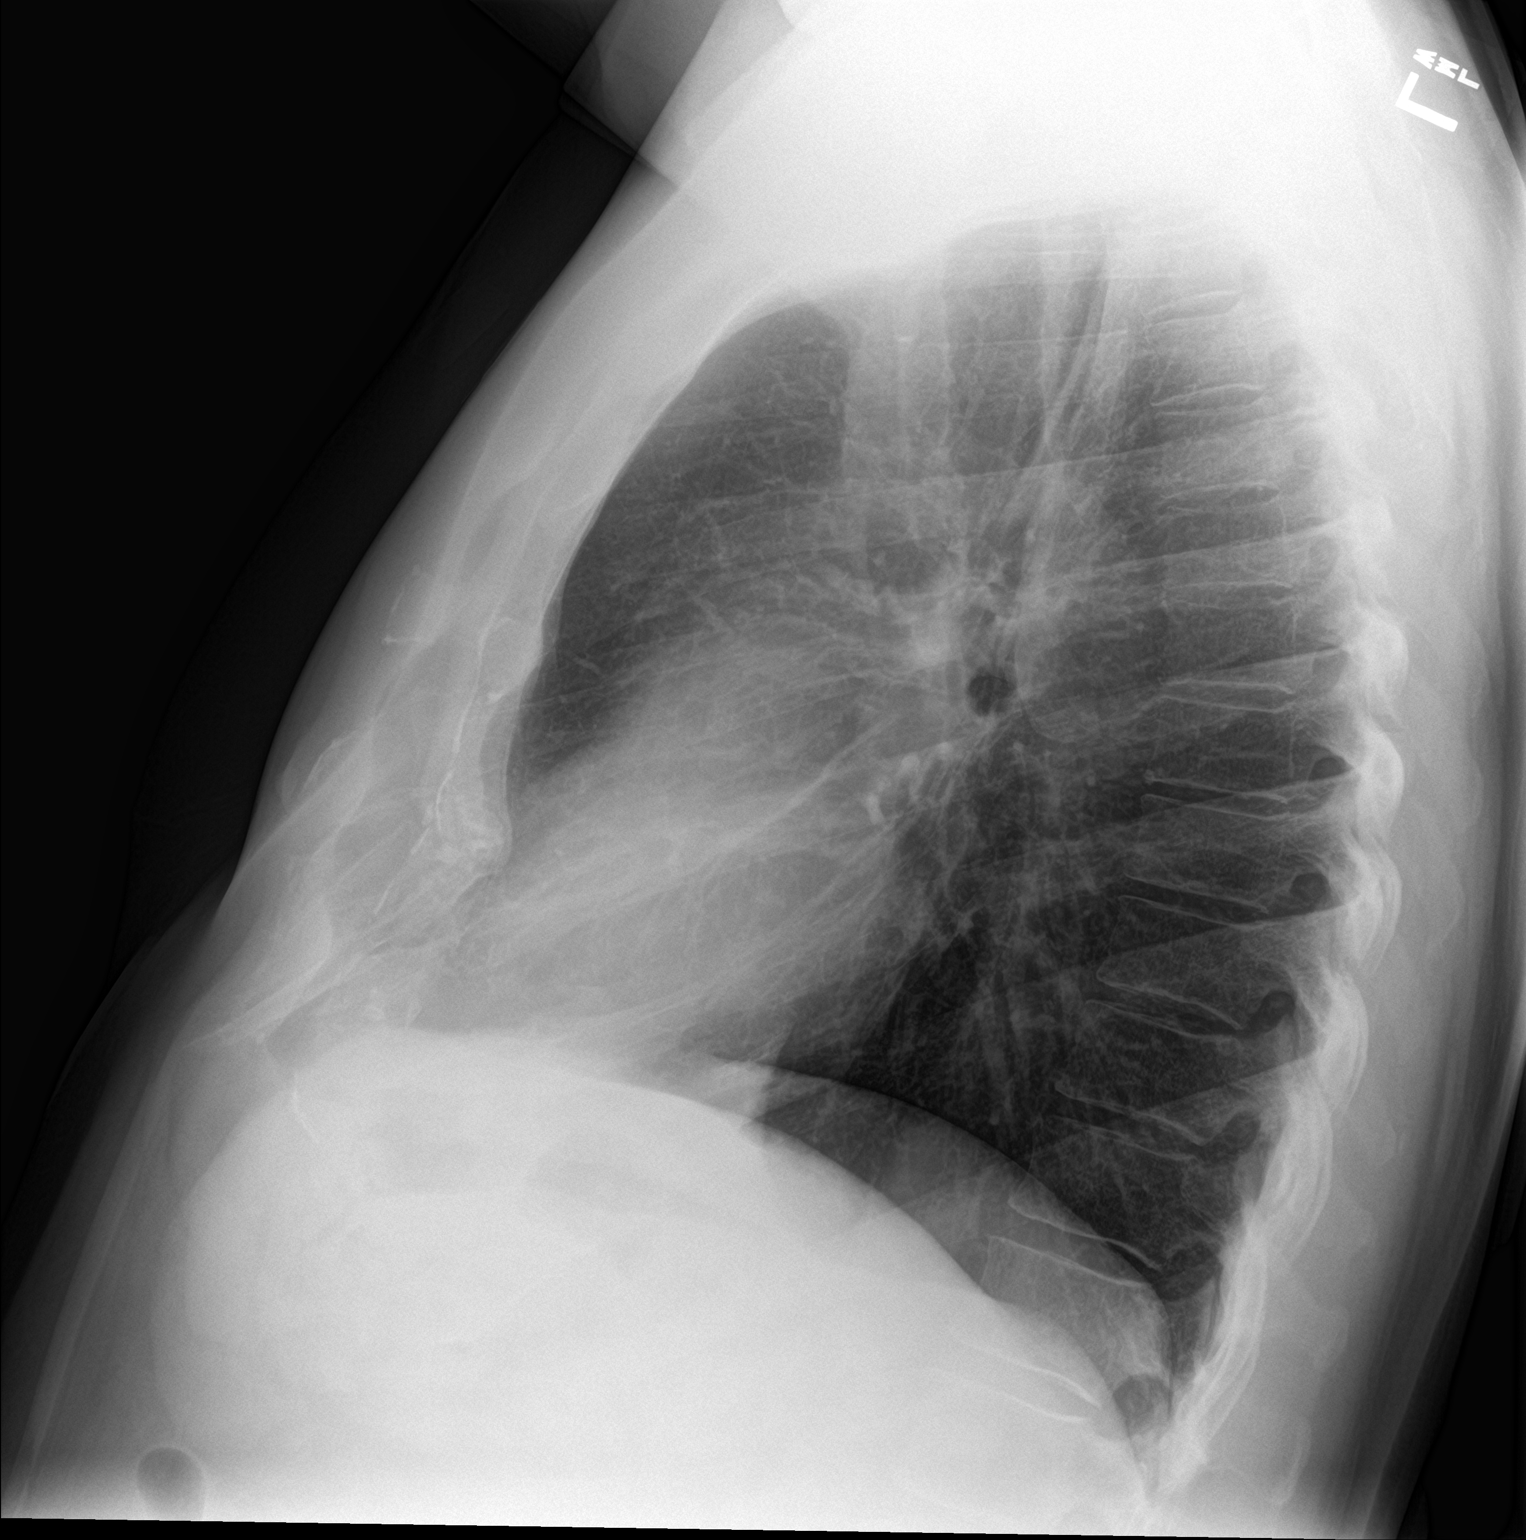

[2 of 2 positions shown; findings below may reference images not displayed]

FINDINGS: Hyperinflation with mild bronchitic changes. No acute opacity or
pleural effusion. Normal heart size. Aortic atherosclerosis. No
pneumothorax.
IMPRESSION: No active cardiopulmonary disease.
